# Patient Record
Sex: Female | Born: 1985 | Hispanic: Yes | Marital: Married | Smoking: Never smoker
Health system: Southern US, Community
[De-identification: ages and names within clinical notes are randomized; demographics above are authoritative.]

## PROBLEM LIST (undated history)

## (undated) ENCOUNTER — Inpatient Hospital Stay (HOSPITAL_COMMUNITY): Payer: Self-pay

## (undated) DIAGNOSIS — Z789 Other specified health status: Secondary | ICD-10-CM

## (undated) DIAGNOSIS — I8390 Asymptomatic varicose veins of unspecified lower extremity: Secondary | ICD-10-CM

## (undated) HISTORY — PX: NO PAST SURGERIES: SHX2092

---

## 2009-06-22 ENCOUNTER — Inpatient Hospital Stay (HOSPITAL_COMMUNITY): Admission: AD | Admit: 2009-06-22 | Discharge: 2009-06-22 | Payer: Self-pay | Admitting: Obstetrics & Gynecology

## 2009-10-04 ENCOUNTER — Encounter: Admission: RE | Admit: 2009-10-04 | Discharge: 2009-10-04 | Payer: Self-pay | Admitting: Family Medicine

## 2009-10-07 ENCOUNTER — Ambulatory Visit (HOSPITAL_COMMUNITY): Admission: RE | Admit: 2009-10-07 | Discharge: 2009-10-07 | Payer: Self-pay | Admitting: Obstetrics & Gynecology

## 2009-12-30 ENCOUNTER — Ambulatory Visit (HOSPITAL_COMMUNITY): Admission: RE | Admit: 2009-12-30 | Discharge: 2009-12-30 | Payer: Self-pay | Admitting: Obstetrics & Gynecology

## 2010-01-29 ENCOUNTER — Ambulatory Visit: Payer: Self-pay | Admitting: Obstetrics & Gynecology

## 2010-01-30 ENCOUNTER — Inpatient Hospital Stay (HOSPITAL_COMMUNITY): Admission: AD | Admit: 2010-01-30 | Discharge: 2010-01-30 | Payer: Self-pay | Admitting: Obstetrics & Gynecology

## 2010-01-30 ENCOUNTER — Inpatient Hospital Stay (HOSPITAL_COMMUNITY): Admission: AD | Admit: 2010-01-30 | Discharge: 2010-02-01 | Payer: Self-pay | Admitting: Obstetrics and Gynecology

## 2010-01-30 ENCOUNTER — Ambulatory Visit: Payer: Self-pay | Admitting: Family Medicine

## 2011-03-18 LAB — CBC
HCT: 39 % (ref 36.0–46.0)
Hemoglobin: 12.9 g/dL (ref 12.0–15.0)
Platelets: 340 10*3/uL (ref 150–400)
WBC: 17.8 10*3/uL — ABNORMAL HIGH (ref 4.0–10.5)

## 2011-04-06 LAB — URINALYSIS, ROUTINE W REFLEX MICROSCOPIC
Glucose, UA: NEGATIVE mg/dL
Leukocytes, UA: NEGATIVE
Protein, ur: NEGATIVE mg/dL
Specific Gravity, Urine: 1.02 (ref 1.005–1.030)
Urobilinogen, UA: 0.2 mg/dL (ref 0.0–1.0)

## 2011-04-06 LAB — ABO/RH: ABO/RH(D): O POS

## 2011-04-06 LAB — CBC
MCV: 90 fL (ref 78.0–100.0)
Platelets: 337 10*3/uL (ref 150–400)
RBC: 4.34 MIL/uL (ref 3.87–5.11)
WBC: 11.3 10*3/uL — ABNORMAL HIGH (ref 4.0–10.5)

## 2011-04-06 LAB — HCG, QUANTITATIVE, PREGNANCY: hCG, Beta Chain, Quant, S: 132730 m[IU]/mL — ABNORMAL HIGH (ref <5)

## 2011-04-06 LAB — POCT PREGNANCY, URINE: Preg Test, Ur: POSITIVE

## 2011-04-06 LAB — URINE MICROSCOPIC-ADD ON

## 2011-04-06 LAB — WET PREP, GENITAL
Trich, Wet Prep: NONE SEEN
Yeast Wet Prep HPF POC: NONE SEEN

## 2014-12-28 NOTE — L&D Delivery Note (Signed)
Delivery Note At 8:56 AM a viable and healthy female was delivered via  (Presentation: Right Occiput Posterior).  APGAR: 7, 9; weight pending.   Pitocin started w/ delivery of baby for active management of third stage. Placenta status: Adherent at right fundal area, Manual removal. Placenta appeared intact except for questionable area that was smooth and C/W heart -shaped placenta. Cord: 3 vessels with the following complications: None.  Cord pH: NA  Dr. Jolayne Panther called to [facility] when manual removal started. Pitocin stopped. CNM removed placenta w/ difficulty due to adherent area in fundus. One moderate gush of blood, pulse 90-120's. BP stable, mildly elevated w/ pain of uterine exploration. Upon second sweep of uterus no additional placenta fragments were felt or removed, but uterus became boggy and full of clots and blood. Bladder emptied while Dr. Jolayne Panther swept uterus again and inspected cervix for lacs (intact). No placenta fragments found. Pitocin, Methergine given. Fundus firmer w/ massage, but bleeding still moderate. Cytotec given. Fundus firm. Bleeding small amount. VSS.    Anesthesia: Epidural  Episiotomy: None Lacerations: None Suture Repair: NA Est. Blood Loss (mL): 911  Mom to postpartum.  Baby to Couplet care / Skin to Skin. Placenta to: Pathology Feeding: Br/Bo Circ: No Contraception: Undecided  Methergine series x 24 hours.  Discussed blood loss w/ pt. Will check CBC now and in am. Brief discussion of transfusion if pt significantly symptomatic. She consents to transfusion if needed.   Jillian Turner 09/24/2015, 9:55 AM

## 2015-02-11 ENCOUNTER — Inpatient Hospital Stay (HOSPITAL_COMMUNITY)
Admission: AD | Admit: 2015-02-11 | Discharge: 2015-02-11 | Disposition: A | Discharge status: Home / Self Care | Payer: Self-pay | Source: Ambulatory Visit | Attending: Obstetrics & Gynecology | Admitting: Obstetrics & Gynecology

## 2015-02-11 ENCOUNTER — Encounter (HOSPITAL_COMMUNITY): Payer: Self-pay

## 2015-02-11 ENCOUNTER — Inpatient Hospital Stay (HOSPITAL_COMMUNITY): Payer: Self-pay

## 2015-02-11 DIAGNOSIS — O4690 Antepartum hemorrhage, unspecified, unspecified trimester: Secondary | ICD-10-CM

## 2015-02-11 DIAGNOSIS — Z3A08 8 weeks gestation of pregnancy: Secondary | ICD-10-CM

## 2015-02-11 DIAGNOSIS — Z3A01 Less than 8 weeks gestation of pregnancy: Secondary | ICD-10-CM | POA: Insufficient documentation

## 2015-02-11 DIAGNOSIS — B9689 Other specified bacterial agents as the cause of diseases classified elsewhere: Secondary | ICD-10-CM

## 2015-02-11 DIAGNOSIS — N760 Acute vaginitis: Secondary | ICD-10-CM | POA: Insufficient documentation

## 2015-02-11 DIAGNOSIS — A499 Bacterial infection, unspecified: Secondary | ICD-10-CM

## 2015-02-11 DIAGNOSIS — O43891 Other placental disorders, first trimester: Secondary | ICD-10-CM

## 2015-02-11 DIAGNOSIS — O208 Other hemorrhage in early pregnancy: Secondary | ICD-10-CM | POA: Insufficient documentation

## 2015-02-11 DIAGNOSIS — O23591 Infection of other part of genital tract in pregnancy, first trimester: Secondary | ICD-10-CM | POA: Insufficient documentation

## 2015-02-11 DIAGNOSIS — R109 Unspecified abdominal pain: Secondary | ICD-10-CM | POA: Insufficient documentation

## 2015-02-11 HISTORY — DX: Other specified health status: Z78.9

## 2015-02-11 LAB — URINALYSIS, ROUTINE W REFLEX MICROSCOPIC
BILIRUBIN URINE: NEGATIVE
Glucose, UA: NEGATIVE mg/dL
KETONES UR: NEGATIVE mg/dL
Leukocytes, UA: NEGATIVE
Nitrite: NEGATIVE
PH: 6 (ref 5.0–8.0)
PROTEIN: NEGATIVE mg/dL
Specific Gravity, Urine: 1.025 (ref 1.005–1.030)
UROBILINOGEN UA: 0.2 mg/dL (ref 0.0–1.0)

## 2015-02-11 LAB — WET PREP, GENITAL
TRICH WET PREP: NONE SEEN
YEAST WET PREP: NONE SEEN

## 2015-02-11 LAB — CBC
HCT: 36.2 % (ref 36.0–46.0)
HEMOGLOBIN: 12.3 g/dL (ref 12.0–15.0)
MCH: 29.9 pg (ref 26.0–34.0)
MCHC: 34 g/dL (ref 30.0–36.0)
MCV: 88.1 fL (ref 78.0–100.0)
PLATELETS: 301 10*3/uL (ref 150–400)
RBC: 4.11 MIL/uL (ref 3.87–5.11)
RDW: 13 % (ref 11.5–15.5)
WBC: 8.4 10*3/uL (ref 4.0–10.5)

## 2015-02-11 LAB — URINE MICROSCOPIC-ADD ON

## 2015-02-11 LAB — HCG, QUANTITATIVE, PREGNANCY: hCG, Beta Chain, Quant, S: 79671 m[IU]/mL — ABNORMAL HIGH (ref <5)

## 2015-02-11 LAB — POCT PREGNANCY, URINE: Preg Test, Ur: POSITIVE — AB

## 2015-02-11 MED ORDER — METRONIDAZOLE 500 MG PO TABS: 500.0000 mg | ORAL_TABLET | Freq: Two times a day (BID) | ORAL | Status: DC

## 2015-02-11 NOTE — Progress Notes (Signed)
I assisted [name], RN with questions.  Eda H Royal Interpreter. °

## 2015-02-11 NOTE — Progress Notes (Signed)
I assisted Jennifer,NP with explanation of care plan and I was with the patient in US. Eda H Royal Interpreter.

## 2015-02-11 NOTE — MAU Note (Signed)
Pos HPT 3 weeks ago, had small amount of bleeding 2 weeks ago which stopped.  Started spotting again last night, also lower abd pain.

## 2015-02-11 NOTE — Discharge Instructions (Signed)
Vaginosis bacteriana (Bacterial Vaginosis) La vaginosis bacteriana es una infeccin vaginal que perturba el equilibrio normal de las bacterias que se encuentran en la vagina. Es el resultado de un crecimiento excesivo de ciertas bacterias. Esta es la infeccin vaginal ms frecuente en mujeres en edad reproductiva. El tratamiento es importante para prevenir complicaciones, especialmente en mujeres embarazadas, dado que puede causar un parto prematuro. CAUSAS  La vaginosis bacteriana se origina por un aumento de bacterias nocivas que, generalmente, estn presentes en cantidades ms pequeas en la vagina. Varios tipos diferentes de bacterias pueden causar esta afeccin. Sin embargo, la causa de su desarrollo no se comprende totalmente. FACTORES DE RIESGO Ciertas actividades o comportamientos pueden exponerlo a un mayor riesgo de desarrollar vaginosis bacteriana, entre los que se incluyen:  Tener una nueva pareja sexual o mltiples parejas sexuales.  Las duchas vaginales  El uso del DIU (dispositivo intrauterino) como mtodo anticonceptivo. El contagio no se produce en baos, por ropas de cama, en piscinas o por contacto con objetos. SIGNOS Y SNTOMAS  Algunas mujeres que padecen vaginosis bacteriana no presentan signos ni sntomas. Los sntomas ms comunes son:  Secrecin vaginal de color grisceo.  Secrecin vaginal con olor similar al pescado, especialmente despus de mantener relaciones sexuales.  Picazn o sensacin de ardor en la vagina o la vulva.  Ardor o dolor al orinar. DIAGNSTICO  Su mdico analizar su historia clnica y le examinar la vagina para detectar signos de vaginosis bacteriana. Puede tomarle una muestra de flujo vaginal. Su mdico examinar esta muestra con un microscopio para controlar las bacterias y clulas anormales. Tambin puede realizarse un anlisis del pH vaginal.  TRATAMIENTO  La vaginosis bacteriana puede tratarse con antibiticos, en forma de comprimidos o  de crema vaginal. Puede indicarse una segunda tanda de antibiticos si la afeccin se repite despus del tratamiento.  INSTRUCCIONES PARA EL CUIDADO EN EL HOGAR   Tome solo medicamentos de venta libre o recetados, segn las indicaciones del mdico.  Si le han recetado antibiticos, tmelos como se le indic. Asegrese de que finaliza la prescripcin completa aunque se sienta mejor.  No mantenga relaciones sexuales hasta completar el tratamiento.  Comunique a sus compaeros sexuales que sufre una infeccin vaginal. Deben consultar a su mdico y recibir tratamiento si tienen problemas, como picazn o una erupcin cutnea leve.  Practique el sexo seguro usando preservativos y tenga un nico compaero sexual. SOLICITE ATENCIN MDICA SI:   Sus sntomas no mejoran despus de 3 das de tratamiento.  Aumenta la secrecin o el dolor.  Tiene fiebre. ASEGRESE DE QUE:   Comprende estas instrucciones.  Controlar su afeccin.  Recibir ayuda de inmediato si no mejora o si empeora. PARA OBTENER MS INFORMACIN  Centros para el control y la prevencin de enfermedades (Centers for Disease Control and Prevention, CDC): www.cdc.gov/std Asociacin Est[place]oun[place]ense de la Salud Sexual (American Sexual Health Association, SHA): www.ashastd.org  Document Released: 03/22/2008 Document Revised: 10/04/2013 ExitCare Patient Information 2015 [employer]. This information is not intended to replace advice given to you by your health care provider. Make sure you discuss any questions you have with your health care provider.  

## 2015-02-11 NOTE — MAU Note (Signed)
Per Eda, spanish interpreter, pt denies pain. No clots. Sometime sees BRB, sometimes is brown. Denies uti s/s. Last intercourse 2 weeks ago.

## 2015-02-11 NOTE — MAU Provider Note (Signed)
History     CSN: [id number]  Arrival date and time: 02/11/15 [id number]   First Provider Initiated Contact with Patient 02/11/15 0915      Chief Complaint  Patient presents with  . Vaginal Bleeding  . Abdominal Pain   HPI   Ms. Jillian Turner is a 29 y.o. female G3P1001 at 6783w1d presenting with abdominal pain and vaginal bleeding. The bleeding started 2 weeks ago and it stopped.  The bleeding resumed last night; she denies recent intercourse. She has continued to have spotting this morning. The color of the bleeding is bright red/ brown at times.  She is having a little bit of pain; lower abdominal pain and lower back pain.   OB History    Gravida Para Term Preterm AB TAB SAB Ectopic Multiple Living   3 1 1       1       Past Medical History  Diagnosis Date  . Medical history non-contributory     Past Surgical History  Procedure Laterality Date  . No past surgeries      No family history on file.  History  Substance Use Topics  . Smoking status: Not on file  . Smokeless tobacco: Not on file  . Alcohol Use: Not on file    Allergies: No Known Allergies  No prescriptions prior to admission   Results for orders placed or performed during the hospital encounter of 02/11/15 (from the past 48 hour(s))  Urinalysis, Routine w reflex microscopic     Status: Abnormal   Collection Time: 02/11/15  8:25 AM  Result Value Ref Range   Color, Urine YELLOW YELLOW   APPearance CLEAR CLEAR   Specific Gravity, Urine 1.025 1.005 - 1.030   pH 6.0 5.0 - 8.0   Glucose, UA NEGATIVE NEGATIVE mg/dL   Hgb urine dipstick SMALL (A) NEGATIVE   Bilirubin Urine NEGATIVE NEGATIVE   Ketones, ur NEGATIVE NEGATIVE mg/dL   Protein, ur NEGATIVE NEGATIVE mg/dL   Urobilinogen, UA 0.2 0.0 - 1.0 mg/dL   Nitrite NEGATIVE NEGATIVE   Leukocytes, UA NEGATIVE NEGATIVE  Urine microscopic-add on     Status: Abnormal   Collection Time: 02/11/15  8:25 AM  Result Value Ref Range   Squamous Epithelial / LPF  FEW (A) RARE   WBC, UA 0-2 <3 WBC/hpf   RBC / HPF 3-6 <3 RBC/hpf   Bacteria, UA FEW (A) RARE   Urine-Other MUCOUS PRESENT   Pregnancy, urine POC     Status: Abnormal   Collection Time: 02/11/15  8:37 AM  Result Value Ref Range   Preg Test, Ur POSITIVE (A) NEGATIVE    Comment:        THE SENSITIVITY OF THIS METHODOLOGY IS >24 mIU/mL   Wet prep, genital     Status: Abnormal   Collection Time: 02/11/15  9:45 AM  Result Value Ref Range   Yeast Wet Prep HPF POC NONE SEEN NONE SEEN   Trich, Wet Prep NONE SEEN NONE SEEN   Clue Cells Wet Prep HPF POC MODERATE (A) NONE SEEN   WBC, Wet Prep HPF POC FEW (A) NONE SEEN    Comment: MANY BACTERIA SEEN  CBC     Status: None   Collection Time: 02/11/15  9:54 AM  Result Value Ref Range   WBC 8.4 4.0 - 10.5 K/uL   RBC 4.11 3.87 - 5.11 MIL/uL   Hemoglobin 12.3 12.0 - 15.0 g/dL   HCT 56.236.2 13.036.0 - 86.546.0 %   MCV 88.1  78.0 - 100.0 fL   MCH 29.9 26.0 - 34.0 pg   MCHC 34.0 30.0 - 36.0 g/dL   RDW 40.9 81.1 - 91.4 %   Platelets 301 150 - 400 K/uL  hCG, quantitative, pregnancy     Status: Abnormal   Collection Time: 02/11/15  9:54 AM  Result Value Ref Range   hCG, Beta Chain, Quant, S 78295 (H) <5 mIU/mL    Comment:          GEST. AGE      CONC.  (mIU/mL)   <=1 WEEK        5 - 50     2 WEEKS       50 - 500     3 WEEKS       100 - 10,000     4 WEEKS     1,000 - 30,000     5 WEEKS     3,500 - 115,000   6-8 WEEKS     12,000 - 270,000    12 WEEKS     15,000 - 220,000        FEMALE AND NON-PREGNANT FEMALE:     LESS THAN 5 mIU/mL    US Ob Comp Less 14 Wks  02/11/2015   ADDENDUM REPORT: 02/11/2015 11:35  ADDENDUM: A small subchorionic hemorrhage is noted.   Electronically Signed   By: Marin Roberts M.D.   On: 02/11/2015 11:35   02/11/2015   CLINICAL DATA:  Vaginal bleeding and pregnancy. First-trimester pregnancy.  EXAM: OBSTETRIC <14 WK [place] AND TRANSVAGINAL OB US  TECHNIQUE: Both transabdominal and transvaginal ultrasound examinations were  performed for complete evaluation of the gestation as well as the maternal uterus, adnexal regions, and pelvic cul-de-sac. Transvaginal technique was performed to assess early pregnancy.  COMPARISON:  None for this pregnancy.  FINDINGS: Intrauterine gestational sac: Present.  Single.  Yolk sac:  Present  Embryo:  Present  Cardiac Activity: Present  Heart Rate: 154  bpm  MSD:   mm    w     d  CRL:  1.49  mm   7 w   6 d                  [place] EDC: 09/24/2015  Maternal uterus/adnexa: Uterus and adnexa are otherwise unremarkable. A corpus luteal cyst is present on the left. There is no significant free fluid.  IMPRESSION: 1. Single intrauterine pregnancy with an estimated gestational age of [redacted] weeks and 6 days. 2. No complicating features evident by ultrasound.  Electronically Signed: By: Marin Roberts M.D. On: 02/11/2015 11:01   US Ob Transvaginal  02/11/2015   ADDENDUM REPORT: 02/11/2015 11:35  ADDENDUM: A small subchorionic hemorrhage is noted.   Electronically Signed   By: Marin Roberts M.D.   On: 02/11/2015 11:35   02/11/2015   CLINICAL DATA:  Vaginal bleeding and pregnancy. First-trimester pregnancy.  EXAM: OBSTETRIC <14 WK [place] AND TRANSVAGINAL OB US  TECHNIQUE: Both transabdominal and transvaginal ultrasound examinations were performed for complete evaluation of the gestation as well as the maternal uterus, adnexal regions, and pelvic cul-de-sac. Transvaginal technique was performed to assess early pregnancy.  COMPARISON:  None for this pregnancy.  FINDINGS: Intrauterine gestational sac: Present.  Single.  Yolk sac:  Present  Embryo:  Present  Cardiac Activity: Present  Heart Rate: 154  bpm  MSD:   mm    w     d  CRL:  1.49  mm   7  w   6 d                  [place] EDC: 09/24/2015  Maternal uterus/adnexa: Uterus and adnexa are otherwise unremarkable. A corpus luteal cyst is present on the left. There is no significant free fluid.  IMPRESSION: 1. Single intrauterine pregnancy with an estimated gestational  age of [redacted] weeks and 6 days. 2. No complicating features evident by ultrasound.  Electronically Signed: By: Marin Roberts M.D. On: 02/11/2015 11:01   Review of Systems  Constitutional: Negative for fever and chills.  Gastrointestinal: Positive for nausea and abdominal pain (Bilateral lower abdominal pain ). Negative for vomiting.  Genitourinary: Positive for frequency. Negative for dysuria, urgency and hematuria.   Physical Exam   Blood pressure 111/56, pulse 87, temperature 98.8 F (37.1 C), temperature source Oral, resp. rate 16, height 5' 1.5" (1.562 m), weight 51.166 kg (112 lb 12.8 oz), last menstrual period 12/16/2014.  Physical Exam  Constitutional: She is oriented to person, place, and time. She appears well-developed and well-nourished. No distress.  HENT:  Head: Normocephalic.  Eyes: Pupils are equal, round, and reactive to light.  Neck: Neck supple.  Respiratory: Effort normal.  GI: Soft. She exhibits no distension. There is no tenderness. There is no rebound and no guarding.  Genitourinary:  Speculum exam: Vagina - Small amount of creamy, brown blood noted in the vaginal canal. Cervix - No contact bleeding Bimanual exam: Cervix closed, no CMT  Uterus non tender, enlarged  Adnexa non tender, no masses bilaterally GC/Chlam, wet prep done Chaperone present for exam.   Neurological: She is alert and oriented to person, place, and time.  Skin: Skin is warm. She is not diaphoretic.  Psychiatric: Her behavior is normal.    MAU Course  Procedures  None  MDM CBC O positive blood type Hcg [place]  Preliminary report shows subchorionic hemorrhage noted. Spoke to Dr. Alfredo Batty who reviewed the [place] again and will addend and note a small subchorionic hemorrhage.   Assessment and Plan   A: Vaginal bleeding in pregnancy SIUP @ [redacted]w[redacted]d with cardiac activity  Bacterial vaginosis Small subchorionic hemorrhage.  P: Discharge home in stable condition Pelvic rest Bleeding  precautions Start prenatal care ASAP RX: Flagyl Return to MAU if symptoms worsen.  Jillian Hansen Janijah Symons, NP 02/11/2015 5:08 PM

## 2015-02-12 LAB — GC/CHLAMYDIA PROBE AMP (~~LOC~~) NOT AT ARMC
Chlamydia: NEGATIVE
NEISSERIA GONORRHEA: NEGATIVE

## 2015-02-12 LAB — HIV ANTIBODY (ROUTINE TESTING W REFLEX): HIV Screen 4th Generation wRfx: NONREACTIVE

## 2015-04-04 ENCOUNTER — Other Ambulatory Visit (HOSPITAL_COMMUNITY): Payer: Self-pay | Admitting: Nurse Practitioner

## 2015-04-04 DIAGNOSIS — Z3689 Encounter for other specified antenatal screening: Secondary | ICD-10-CM

## 2015-04-04 LAB — OB RESULTS CONSOLE RUBELLA ANTIBODY, IGM: Rubella: IMMUNE

## 2015-04-04 LAB — OB RESULTS CONSOLE HEPATITIS B SURFACE ANTIGEN: Hepatitis B Surface Ag: NEGATIVE

## 2015-04-04 LAB — OB RESULTS CONSOLE HIV ANTIBODY (ROUTINE TESTING)
HIV: NONREACTIVE
HIV: NONREACTIVE

## 2015-04-04 LAB — OB RESULTS CONSOLE RPR: RPR: NONREACTIVE

## 2015-04-30 ENCOUNTER — Ambulatory Visit (HOSPITAL_COMMUNITY)
Admission: RE | Admit: 2015-04-30 | Discharge: 2015-04-30 | Disposition: A | Discharge status: Home / Self Care | Payer: Self-pay | Source: Ambulatory Visit | Attending: Nurse Practitioner | Admitting: Nurse Practitioner

## 2015-04-30 DIAGNOSIS — Z3689 Encounter for other specified antenatal screening: Secondary | ICD-10-CM | POA: Insufficient documentation

## 2015-04-30 DIAGNOSIS — Z3A19 19 weeks gestation of pregnancy: Secondary | ICD-10-CM | POA: Insufficient documentation

## 2015-04-30 DIAGNOSIS — Z36 Encounter for antenatal screening of mother: Secondary | ICD-10-CM | POA: Insufficient documentation

## 2015-05-02 ENCOUNTER — Other Ambulatory Visit (HOSPITAL_COMMUNITY): Payer: Self-pay | Admitting: Nurse Practitioner

## 2015-05-02 DIAGNOSIS — IMO0002 Reserved for concepts with insufficient information to code with codable children: Secondary | ICD-10-CM

## 2015-05-02 DIAGNOSIS — Z0489 Encounter for examination and observation for other specified reasons: Secondary | ICD-10-CM

## 2015-05-02 DIAGNOSIS — Z3689 Encounter for other specified antenatal screening: Secondary | ICD-10-CM

## 2015-06-11 ENCOUNTER — Ambulatory Visit (HOSPITAL_COMMUNITY)
Admission: RE | Admit: 2015-06-11 | Discharge: 2015-06-11 | Disposition: A | Discharge status: Home / Self Care | Payer: Self-pay | Source: Ambulatory Visit | Attending: Nurse Practitioner | Admitting: Nurse Practitioner

## 2015-06-11 ENCOUNTER — Ambulatory Visit (HOSPITAL_COMMUNITY): Payer: Self-pay

## 2015-06-11 DIAGNOSIS — IMO0002 Reserved for concepts with insufficient information to code with codable children: Secondary | ICD-10-CM

## 2015-06-11 DIAGNOSIS — Z36 Encounter for antenatal screening of mother: Secondary | ICD-10-CM | POA: Insufficient documentation

## 2015-06-11 DIAGNOSIS — Z0489 Encounter for examination and observation for other specified reasons: Secondary | ICD-10-CM

## 2015-07-15 ENCOUNTER — Other Ambulatory Visit (HOSPITAL_COMMUNITY): Payer: Self-pay | Admitting: Nurse Practitioner

## 2015-07-15 DIAGNOSIS — O26849 Uterine size-date discrepancy, unspecified trimester: Secondary | ICD-10-CM

## 2015-07-17 ENCOUNTER — Ambulatory Visit (HOSPITAL_COMMUNITY)
Admission: RE | Admit: 2015-07-17 | Discharge: 2015-07-17 | Disposition: A | Discharge status: Home / Self Care | Payer: Self-pay | Source: Ambulatory Visit | Attending: Nurse Practitioner | Admitting: Nurse Practitioner

## 2015-07-17 DIAGNOSIS — Z3A30 30 weeks gestation of pregnancy: Secondary | ICD-10-CM | POA: Insufficient documentation

## 2015-07-17 DIAGNOSIS — O26849 Uterine size-date discrepancy, unspecified trimester: Secondary | ICD-10-CM | POA: Insufficient documentation

## 2015-07-17 DIAGNOSIS — O3660X0 Maternal care for excessive fetal growth, unspecified trimester, not applicable or unspecified: Secondary | ICD-10-CM | POA: Insufficient documentation

## 2015-08-22 ENCOUNTER — Other Ambulatory Visit (HOSPITAL_COMMUNITY): Payer: Self-pay | Admitting: Nurse Practitioner

## 2015-08-22 DIAGNOSIS — IMO0002 Reserved for concepts with insufficient information to code with codable children: Secondary | ICD-10-CM

## 2015-08-22 DIAGNOSIS — Z0489 Encounter for examination and observation for other specified reasons: Secondary | ICD-10-CM

## 2015-08-23 ENCOUNTER — Ambulatory Visit (HOSPITAL_COMMUNITY)
Admission: RE | Admit: 2015-08-23 | Discharge: 2015-08-23 | Disposition: A | Discharge status: Home / Self Care | Payer: Self-pay | Source: Ambulatory Visit | Attending: Nurse Practitioner | Admitting: Nurse Practitioner

## 2015-08-23 ENCOUNTER — Other Ambulatory Visit (HOSPITAL_COMMUNITY): Payer: Self-pay | Admitting: Nurse Practitioner

## 2015-08-23 DIAGNOSIS — O26843 Uterine size-date discrepancy, third trimester: Secondary | ICD-10-CM

## 2015-08-23 DIAGNOSIS — IMO0002 Reserved for concepts with insufficient information to code with codable children: Secondary | ICD-10-CM

## 2015-08-23 DIAGNOSIS — Z3A35 35 weeks gestation of pregnancy: Secondary | ICD-10-CM

## 2015-08-23 DIAGNOSIS — Z0489 Encounter for examination and observation for other specified reasons: Secondary | ICD-10-CM

## 2015-08-29 LAB — OB RESULTS CONSOLE GBS: GBS: NEGATIVE

## 2015-08-29 LAB — OB RESULTS CONSOLE GC/CHLAMYDIA
CHLAMYDIA, DNA PROBE: NEGATIVE
Gonorrhea: NEGATIVE

## 2015-09-19 ENCOUNTER — Other Ambulatory Visit (HOSPITAL_COMMUNITY): Payer: Self-pay | Admitting: Nurse Practitioner

## 2015-09-19 DIAGNOSIS — O26843 Uterine size-date discrepancy, third trimester: Secondary | ICD-10-CM

## 2015-09-19 DIAGNOSIS — Z3A40 40 weeks gestation of pregnancy: Secondary | ICD-10-CM

## 2015-09-23 ENCOUNTER — Other Ambulatory Visit (HOSPITAL_COMMUNITY): Payer: Self-pay | Admitting: Nurse Practitioner

## 2015-09-23 ENCOUNTER — Encounter (HOSPITAL_COMMUNITY): Payer: Self-pay | Admitting: Obstetrics and Gynecology

## 2015-09-23 ENCOUNTER — Ambulatory Visit (HOSPITAL_COMMUNITY)
Admission: RE | Admit: 2015-09-23 | Discharge: 2015-09-23 | Disposition: A | Discharge status: Home / Self Care | Payer: Self-pay | Source: Ambulatory Visit | Attending: Nurse Practitioner | Admitting: Nurse Practitioner

## 2015-09-23 ENCOUNTER — Inpatient Hospital Stay (HOSPITAL_COMMUNITY)
Admission: AD | Admit: 2015-09-23 | Discharge: 2015-09-25 | DRG: 774 | Disposition: A | Discharge status: Home / Self Care | Payer: Medicaid Other | Source: Ambulatory Visit | Attending: Family Medicine | Admitting: Family Medicine

## 2015-09-23 DIAGNOSIS — Z3A40 40 weeks gestation of pregnancy: Secondary | ICD-10-CM | POA: Diagnosis present

## 2015-09-23 DIAGNOSIS — O480 Post-term pregnancy: Secondary | ICD-10-CM | POA: Diagnosis present

## 2015-09-23 DIAGNOSIS — O864 Pyrexia of unknown origin following delivery: Secondary | ICD-10-CM | POA: Diagnosis present

## 2015-09-23 DIAGNOSIS — O4103X0 Oligohydramnios, third trimester, not applicable or unspecified: Secondary | ICD-10-CM | POA: Diagnosis present

## 2015-09-23 DIAGNOSIS — O09299 Supervision of pregnancy with other poor reproductive or obstetric history, unspecified trimester: Secondary | ICD-10-CM | POA: Diagnosis present

## 2015-09-23 DIAGNOSIS — O4100X0 Oligohydramnios, unspecified trimester, not applicable or unspecified: Secondary | ICD-10-CM | POA: Diagnosis present

## 2015-09-23 DIAGNOSIS — O26843 Uterine size-date discrepancy, third trimester: Secondary | ICD-10-CM | POA: Insufficient documentation

## 2015-09-23 HISTORY — DX: Asymptomatic varicose veins of unspecified lower extremity: I83.90

## 2015-09-23 LAB — TYPE AND SCREEN
ABO/RH(D): O POS
ANTIBODY SCREEN: NEGATIVE

## 2015-09-23 LAB — CBC
HCT: 33.9 % — ABNORMAL LOW (ref 36.0–46.0)
Hemoglobin: 10.9 g/dL — ABNORMAL LOW (ref 12.0–15.0)
MCH: 27.6 pg (ref 26.0–34.0)
MCHC: 32.2 g/dL (ref 30.0–36.0)
MCV: 85.8 fL (ref 78.0–100.0)
PLATELETS: 318 10*3/uL (ref 150–400)
RBC: 3.95 MIL/uL (ref 3.87–5.11)
RDW: 14.6 % (ref 11.5–15.5)
WBC: 10.1 10*3/uL (ref 4.0–10.5)

## 2015-09-23 MED ORDER — MISOPROSTOL 25 MCG QUARTER TABLET
25.0000 ug | ORAL_TABLET | ORAL | Status: DC | PRN
  Administered 2015-09-23 (×2): 25 ug via VAGINAL
  Filled 2015-09-23 (×3): qty 0.25

## 2015-09-23 MED ORDER — LACTATED RINGERS IV SOLN: 500.0000 mL | INTRAVENOUS | Status: DC | PRN

## 2015-09-23 MED ORDER — LIDOCAINE HCL (PF) 1 % IJ SOLN
30.0000 mL | INTRAMUSCULAR | Status: DC | PRN
  Filled 2015-09-23: qty 30

## 2015-09-23 MED ORDER — TERBUTALINE SULFATE 1 MG/ML IJ SOLN: 0.2500 mg | Freq: Once | INTRAMUSCULAR | Status: DC | PRN

## 2015-09-23 MED ORDER — CITRIC ACID-SODIUM CITRATE 334-500 MG/5ML PO SOLN: 30.0000 mL | ORAL | Status: DC | PRN

## 2015-09-23 MED ORDER — LACTATED RINGERS IV SOLN
INTRAVENOUS | Status: DC
  Administered 2015-09-23 – 2015-09-24 (×3): via INTRAVENOUS

## 2015-09-23 MED ORDER — OXYTOCIN 40 UNITS IN LACTATED RINGERS INFUSION - SIMPLE MED
62.5000 mL/h | INTRAVENOUS | Status: DC
  Administered 2015-09-24: 500 mL/h via INTRAVENOUS
  Filled 2015-09-23: qty 1000

## 2015-09-23 MED ORDER — OXYTOCIN BOLUS FROM INFUSION: 500.0000 mL | INTRAVENOUS | Status: DC

## 2015-09-23 MED ORDER — ONDANSETRON HCL 4 MG/2ML IJ SOLN
4.0000 mg | Freq: Four times a day (QID) | INTRAMUSCULAR | Status: DC | PRN
  Administered 2015-09-24: 4 mg via INTRAVENOUS
  Filled 2015-09-23 (×2): qty 2

## 2015-09-23 MED ORDER — OXYTOCIN 40 UNITS IN LACTATED RINGERS INFUSION - SIMPLE MED
1.0000 m[IU]/min | INTRAVENOUS | Status: DC
  Administered 2015-09-24: 2 m[IU]/min via INTRAVENOUS
  Filled 2015-09-23: qty 1000

## 2015-09-23 MED ORDER — ACETAMINOPHEN 325 MG PO TABS
650.0000 mg | ORAL_TABLET | ORAL | Status: DC | PRN
  Administered 2015-09-24: 650 mg via ORAL
  Filled 2015-09-23: qty 2

## 2015-09-23 NOTE — H&P (Signed)
LABOR AND DELIVERY ADMISSION HISTORY AND PHYSICAL NOTE  Jillian Turner is a 29 y.o. female G2P1001 with IUP at [redacted]w[redacted]d by L/8 presenting for iol 2/2 oligohydramnios seen in ultrasound today. That u/s obtained for size < dates. EFW is 29th percentile (3066 g). She reports +FMs, No LOF, no VB, no blurry vision, headaches or peripheral edema, and RUQ pain.     Prenatal History/Complications:  Past Medical History: Past Medical History  Diagnosis Date  . Medical history non-contributory     Past Surgical History: Past Surgical History  Procedure Laterality Date  . No past surgeries      Obstetrical History: OB History    Gravida Para Term Preterm AB TAB SAB Ectopic Multiple Living   Social History: Social History   Social History  . Marital Status: Single    Spouse Name: N/A  . Number of Children: N/A  . Years of Education: N/A   Social History Main Topics  . Smoking status: None  . Smokeless tobacco: None  . Alcohol Use: None  . Drug Use: None  . Sexual Activity: Not Asked   Other Topics Concern  . None   Social History Narrative    Family History: No family history on file.  Allergies: No Known Allergies  Prescriptions prior to admission  Medication Sig Dispense Refill Last Dose  . Prenatal Vit-Min-FA-Fish Oil ([facility] PRENATAL GUMMY PO) Take 2 tablets by mouth daily.   02/10/2015 at Unknown time  . [DISCONTINUED] metroNIDAZOLE (FLAGYL) 500 MG tablet Take 1 tablet (500 mg total) by mouth 2 (two) times daily. 14 tablet 0      Review of Systems   All systems reviewed and negative except as stated in HPI  Last menstrual period 12/16/2014. General appearance: alert, cooperative and appears stated age Lungs: clear to auscultation bilaterally Heart: regular rate and rhythm Abdomen: soft, non-tender; bowel sounds normal Pelvic: 1.5/50/-4 Extremities: No calf swelling or tenderness Presentation: cephalic Fetal monitoring:  125/mod/+a/-3 Uterine activity: quiet      Prenatal labs: ABO, Rh:  O positive Antibody:  negative Rubella:  immune RPR:   negative HBsAg:   negative HIV:   nevative GBS: Negative (09/01 0000)  1 hr Glucola 99 Genetic screening  Quad wnl Anatomy US wnl  Prenatal Transfer Tool  Maternal Diabetes: No Genetic Screening: Normal Maternal Ultrasounds/Referrals: Abnormal:  Findings:   Other: oligohydramnios on 9/26 u/s Fetal Ultrasounds or other Referrals:  None Maternal Substance Abuse:  No Significant Maternal Medications:  None Significant Maternal Lab Results: Lab values include: Group B Strep negative  No results found for this or any previous visit (from the past 24 hour(s)).  Patient Active Problem List   Diagnosis Date Noted  . Oligohydramnios 09/23/2015  . [redacted] weeks gestation of pregnancy   . Significant discrepancy between uterine size and clinical dates, antepartum   . [redacted] weeks gestation of pregnancy   . Encounter for fetal anatomic survey     Assessment: Jillian Turner is a 29 y.o. G2P1001 at [redacted]w[redacted]d here for IOL 2/2 oligohydramnios (AFI 4.84 cm today). That u/s obtained for measuring size < dates. Etiology not immediately apparent: otherwise uncomplicated pregnancy with normal anatomy scan and normal quad screen. BP  wnl and no preeclampsia symptoms.  #Labor: will place cytotec and foley, pit when able, AROM when safe to do so #Pain: Eventual epidural #FWB: Cat 1 #ID:  gbs negative #MOF: both #MOC:Paraguard #  Circ:  declines  Silvano Bilis 09/23/2015, 3:53 PM

## 2015-09-23 NOTE — Progress Notes (Signed)
Patient tolerated placement of foley bulb well.

## 2015-09-23 NOTE — Progress Notes (Signed)
[id number] tracing under this account from 1600 until now.

## 2015-09-24 ENCOUNTER — Inpatient Hospital Stay (HOSPITAL_COMMUNITY): Payer: Medicaid Other | Admitting: Anesthesiology

## 2015-09-24 ENCOUNTER — Encounter (HOSPITAL_COMMUNITY): Payer: Self-pay | Admitting: *Deleted

## 2015-09-24 DIAGNOSIS — O864 Pyrexia of unknown origin following delivery: Secondary | ICD-10-CM

## 2015-09-24 DIAGNOSIS — O480 Post-term pregnancy: Secondary | ICD-10-CM

## 2015-09-24 DIAGNOSIS — Z3A40 40 weeks gestation of pregnancy: Secondary | ICD-10-CM

## 2015-09-24 DIAGNOSIS — O4103X0 Oligohydramnios, third trimester, not applicable or unspecified: Secondary | ICD-10-CM

## 2015-09-24 LAB — CBC
HEMATOCRIT: 33.8 % — AB (ref 36.0–46.0)
HEMOGLOBIN: 11 g/dL — AB (ref 12.0–15.0)
MCH: 27.7 pg (ref 26.0–34.0)
MCHC: 32.5 g/dL (ref 30.0–36.0)
MCV: 85.1 fL (ref 78.0–100.0)
Platelets: 295 10*3/uL (ref 150–400)
RBC: 3.97 MIL/uL (ref 3.87–5.11)
RDW: 14.7 % (ref 11.5–15.5)
WBC: 19.8 10*3/uL — AB (ref 4.0–10.5)

## 2015-09-24 LAB — RPR: RPR Ser Ql: NONREACTIVE

## 2015-09-24 MED ORDER — MISOPROSTOL 200 MCG PO TABS
1000.0000 ug | ORAL_TABLET | Freq: Once | ORAL | Status: AC
  Administered 2015-09-24: 1000 ug via RECTAL

## 2015-09-24 MED ORDER — FENTANYL 2.5 MCG/ML BUPIVACAINE 1/10 % EPIDURAL INFUSION (WH - ANES)
14.0000 mL/h | INTRAMUSCULAR | Status: DC | PRN
  Administered 2015-09-24: 12 mL/h via EPIDURAL

## 2015-09-24 MED ORDER — LIDOCAINE-EPINEPHRINE (PF) 2 %-1:200000 IJ SOLN
INTRAMUSCULAR | Status: DC | PRN
  Administered 2015-09-24: 3 mL

## 2015-09-24 MED ORDER — PHENYLEPHRINE 40 MCG/ML (10ML) SYRINGE FOR IV PUSH (FOR BLOOD PRESSURE SUPPORT)
PREFILLED_SYRINGE | INTRAVENOUS | Status: AC
  Filled 2015-09-24: qty 20

## 2015-09-24 MED ORDER — CLINDAMYCIN PHOSPHATE 900 MG/50ML IV SOLN: 900.0000 mg | Freq: Once | INTRAVENOUS | Status: DC

## 2015-09-24 MED ORDER — METHYLERGONOVINE MALEATE 0.2 MG/ML IJ SOLN: 0.2000 mg | INTRAMUSCULAR | Status: DC | PRN

## 2015-09-24 MED ORDER — ACETAMINOPHEN 325 MG PO TABS: 650.0000 mg | ORAL_TABLET | ORAL | Status: DC | PRN

## 2015-09-24 MED ORDER — LANOLIN HYDROUS EX OINT
1.0000 | TOPICAL_OINTMENT | CUTANEOUS | Status: DC | PRN
Start: 2015-09-24 — End: 2015-09-25

## 2015-09-24 MED ORDER — PHENYLEPHRINE 40 MCG/ML (10ML) SYRINGE FOR IV PUSH (FOR BLOOD PRESSURE SUPPORT): 80.0000 ug | PREFILLED_SYRINGE | INTRAVENOUS | Status: DC | PRN

## 2015-09-24 MED ORDER — DIBUCAINE 1 % RE OINT
1.0000 | TOPICAL_OINTMENT | RECTAL | Status: DC | PRN
Start: 2015-09-24 — End: 2015-09-25

## 2015-09-24 MED ORDER — FERROUS SULFATE 325 (65 FE) MG PO TABS
325.0000 mg | ORAL_TABLET | Freq: Two times a day (BID) | ORAL | Status: DC
  Administered 2015-09-24 – 2015-09-25 (×2): 325 mg via ORAL
  Filled 2015-09-24 (×2): qty 1

## 2015-09-24 MED ORDER — ZOLPIDEM TARTRATE 5 MG PO TABS: 5.0000 mg | ORAL_TABLET | Freq: Every evening | ORAL | Status: DC | PRN

## 2015-09-24 MED ORDER — OXYCODONE-ACETAMINOPHEN 5-325 MG PO TABS: 1.0000 | ORAL_TABLET | ORAL | Status: DC | PRN

## 2015-09-24 MED ORDER — OXYTOCIN 40 UNITS IN LACTATED RINGERS INFUSION - SIMPLE MED: 62.5000 mL/h | INTRAVENOUS | Status: DC

## 2015-09-24 MED ORDER — ONDANSETRON HCL 4 MG/2ML IJ SOLN: 4.0000 mg | INTRAMUSCULAR | Status: DC | PRN

## 2015-09-24 MED ORDER — TETANUS-DIPHTH-ACELL PERTUSSIS 5-2.5-18.5 LF-MCG/0.5 IM SUSP: 0.5000 mL | Freq: Once | INTRAMUSCULAR | Status: DC

## 2015-09-24 MED ORDER — FENTANYL 2.5 MCG/ML BUPIVACAINE 1/10 % EPIDURAL INFUSION (WH - ANES)
INTRAMUSCULAR | Status: AC
  Filled 2015-09-24: qty 125

## 2015-09-24 MED ORDER — EPHEDRINE 5 MG/ML INJ: 10.0000 mg | INTRAVENOUS | Status: DC | PRN

## 2015-09-24 MED ORDER — METHYLERGONOVINE MALEATE 0.2 MG/ML IJ SOLN
INTRAMUSCULAR | Status: AC
  Filled 2015-09-24: qty 1

## 2015-09-24 MED ORDER — MISOPROSTOL 200 MCG PO TABS
ORAL_TABLET | ORAL | Status: AC
  Filled 2015-09-24: qty 5

## 2015-09-24 MED ORDER — IBUPROFEN 600 MG PO TABS
600.0000 mg | ORAL_TABLET | Freq: Four times a day (QID) | ORAL | Status: DC
  Administered 2015-09-24 – 2015-09-25 (×5): 600 mg via ORAL
  Filled 2015-09-24 (×5): qty 1

## 2015-09-24 MED ORDER — METHYLERGONOVINE MALEATE 0.2 MG PO TABS: 0.2000 mg | ORAL_TABLET | ORAL | Status: DC | PRN

## 2015-09-24 MED ORDER — SENNOSIDES-DOCUSATE SODIUM 8.6-50 MG PO TABS
2.0000 | ORAL_TABLET | ORAL | Status: DC
  Administered 2015-09-25: 2 via ORAL
  Filled 2015-09-24: qty 2

## 2015-09-24 MED ORDER — GENTAMICIN SULFATE 40 MG/ML IJ SOLN
Freq: Three times a day (TID) | INTRAVENOUS | Status: DC
  Administered 2015-09-24 – 2015-09-25 (×3): via INTRAVENOUS
  Filled 2015-09-24 (×5): qty 2.25

## 2015-09-24 MED ORDER — BUPIVACAINE HCL (PF) 0.25 % IJ SOLN
INTRAMUSCULAR | Status: DC | PRN
  Administered 2015-09-24 (×2): 4 mL via EPIDURAL

## 2015-09-24 MED ORDER — MEASLES, MUMPS & RUBELLA VAC ~~LOC~~ INJ: 0.5000 mL | INJECTION | Freq: Once | SUBCUTANEOUS | Status: DC

## 2015-09-24 MED ORDER — ONDANSETRON HCL 4 MG PO TABS: 4.0000 mg | ORAL_TABLET | ORAL | Status: DC | PRN

## 2015-09-24 MED ORDER — SIMETHICONE 80 MG PO CHEW: 80.0000 mg | CHEWABLE_TABLET | ORAL | Status: DC | PRN

## 2015-09-24 MED ORDER — DIPHENHYDRAMINE HCL 50 MG/ML IJ SOLN: 12.5000 mg | INTRAMUSCULAR | Status: DC | PRN

## 2015-09-24 MED ORDER — DIPHENHYDRAMINE HCL 25 MG PO CAPS: 25.0000 mg | ORAL_CAPSULE | Freq: Four times a day (QID) | ORAL | Status: DC | PRN

## 2015-09-24 MED ORDER — PRENATAL MULTIVITAMIN CH
1.0000 | ORAL_TABLET | Freq: Every day | ORAL | Status: DC
  Administered 2015-09-25: 1 via ORAL
  Filled 2015-09-24: qty 1

## 2015-09-24 MED ORDER — OXYCODONE-ACETAMINOPHEN 5-325 MG PO TABS: 2.0000 | ORAL_TABLET | ORAL | Status: DC | PRN

## 2015-09-24 MED ORDER — WITCH HAZEL-GLYCERIN EX PADS: 1.0000 "application " | MEDICATED_PAD | CUTANEOUS | Status: DC | PRN

## 2015-09-24 MED ORDER — METHYLERGONOVINE MALEATE 0.2 MG/ML IJ SOLN
0.2000 mg | Freq: Once | INTRAMUSCULAR | Status: AC
  Administered 2015-09-24: 0.2 mg via INTRAMUSCULAR

## 2015-09-24 MED ORDER — MAGNESIUM HYDROXIDE 400 MG/5ML PO SUSP: 30.0000 mL | ORAL | Status: DC | PRN

## 2015-09-24 MED ORDER — BENZOCAINE-MENTHOL 20-0.5 % EX AERO: 1.0000 "application " | INHALATION_SPRAY | CUTANEOUS | Status: DC | PRN

## 2015-09-24 NOTE — Progress Notes (Signed)
ANTIBIOTIC CONSULT NOTE - INITIAL  Pharmacy Consult for Gentamicin Indication: Retained Placenta  No Known Allergies  Patient Measurements: Height:  (149.9 cm) Weight: 127 lb (57.607 kg) IBW/kg (Calculated) : 43.2 Adjusted Body Weight: 47.6 kg  Vital Signs: Temp: 101.6 F (38.7 C) (09/27 1215) Temp Source: Oral (09/27 1215) BP: 87/60 mmHg (09/27 1215) Pulse Rate: 89 (09/27 1215)  Labs:  Recent Labs  09/23/15 1615 09/24/15 0954  WBC 10.1 19.8*  HGB 10.9* 11.0*  PLT 318 295     Microbiology: Recent Results (from the past 720 hour(s))  OB RESULT CONSOLE Group B Strep     Status: None   Collection Time: 08/29/15 12:00 AM  Result Value Ref Range Status   GBS Negative  Final    Assessment: 29 y.o. female G2P2001 at [redacted]w[redacted]d being initiated on gentamicin and clindamycin for post-partum fever possibly due to retained placenta.   Estimated Ke = 0.25, Vd = 16.6L  Goal of Therapy:  Gentamicin peak 6-8 mg/L and Trough < 1 mg/L  Plan:  Gentamicin 90 mg IV every 8 hrs with clindamycin 900 mg IV every 8 hrs Check Scr with next labs if gentamicin continued. Will check gentamicin levels if continued > 72hr or clinically indicated.  [place], Mindy [place] 09/24/2015,12:45 PM

## 2015-09-24 NOTE — Anesthesia Preprocedure Evaluation (Signed)

## 2015-09-24 NOTE — Anesthesia Postprocedure Evaluation (Signed)
Anesthesia Post Note  Patient: Jillian Turner  Procedure(s) Performed: * No procedures listed *  Anesthesia type: Epidural  Patient location: Mother/Baby  Post pain: Pain level controlled  Post assessment: Post-op Vital signs reviewed  Last Vitals:  Filed Vitals:   09/24/15 1215  BP: 87/60  Pulse: 89  Temp: 38.7 C  Resp:     Post vital signs: Reviewed  Level of consciousness:alert  Complications: No apparent anesthesia complications

## 2015-09-24 NOTE — Progress Notes (Signed)
Spanish interpreter, Eda, at bedside prior to delivery, after delivery for 10 minutes, and called back in to interpret postpartum events and procedures. All questions were answered.

## 2015-09-24 NOTE — Anesthesia Procedure Notes (Signed)
Epidural Patient location during procedure: OB  Staffing Anesthesiologist: MOSER, CHRISTOPHER Performed by: anesthesiologist   Preanesthetic Checklist Completed: patient identified, surgical consent, pre-op evaluation, timeout performed, IV checked, risks and benefits discussed and monitors and equipment checked  Epidural Patient position: sitting Prep: DuraPrep Patient monitoring: heart rate, cardiac monitor, continuous pulse ox and blood pressure Approach: midline Location: L3-L4 Injection technique: LOR saline  Needle:  Needle type: Tuohy  Needle gauge: 17 G Needle length: 9 cm Needle insertion depth: 6 cm Catheter type: closed end flexible Catheter size: 19 Gauge Catheter at skin depth: 11 cm Test dose: negative and 2% lidocaine with Epi 1:200 K  Assessment Events: blood not aspirated, injection not painful, no injection resistance, negative IV test and no paresthesia  Additional Notes Reason for block:procedure for pain   

## 2015-09-25 ENCOUNTER — Encounter (HOSPITAL_COMMUNITY): Payer: Self-pay | Admitting: Family Medicine

## 2015-09-25 LAB — CBC
HCT: 28.7 % — ABNORMAL LOW (ref 36.0–46.0)
HEMOGLOBIN: 9.1 g/dL — AB (ref 12.0–15.0)
MCH: 27.2 pg (ref 26.0–34.0)
MCHC: 31.7 g/dL (ref 30.0–36.0)
MCV: 85.7 fL (ref 78.0–100.0)
Platelets: 284 10*3/uL (ref 150–400)
RBC: 3.35 MIL/uL — AB (ref 3.87–5.11)
RDW: 15 % (ref 11.5–15.5)
WBC: 22.5 10*3/uL — ABNORMAL HIGH (ref 4.0–10.5)

## 2015-09-25 MED ORDER — IBUPROFEN 600 MG PO TABS: 600.0000 mg | ORAL_TABLET | Freq: Four times a day (QID) | ORAL | Status: DC

## 2015-09-25 NOTE — Discharge Instructions (Signed)

## 2015-09-25 NOTE — Discharge Summary (Signed)
OB Discharge Summary     Patient Name: Jillian Turner DOB: 07-18-1986 MRN: [id number]  Date of admission: 09/23/2015 Delivering MD: Manya Silvas   Date of discharge: 09/25/2015  Admitting diagnosis: direct admit, induction, 40w Intrauterine pregnancy: [redacted]w[redacted]d        Discharge diagnosis: Term Pregnancy Delivered                                           Adherent placenta necessitating manual removal                                             Postpartum Fever                                                                                        Post partum procedures:antibiotics - temperature 101.47F one hour after delivery                                              Manual removal of placenta  Augmentation: AROM, Pitocin, Cytotec and Foley Balloon  Complications: Adherent placenta requiring manual removal of placenta                          Postpartum Fever  Hospital course:  Induction of Labor With Vaginal Delivery   29y.o. yo G2P2001 at 479w2das admitted to the hospital 09/23/2015 for induction of labor.  Indication for induction: oligohydramnios.  Patient had an uncomplicated labor course as follows: Mediations and procedures used include: Foley Bulb, Cytotec, Pitocin and AROM Membrane Rupture Time/Date: 4:58 AM ,09/24/2015   Intrapartum Procedures: Episiotomy: None [1]                                         Lacerations:  None [1]  Patient had delivery of a Viable infant.  Information for the patient's newborn:  CrAliana, Kreischer0[[id number]]Delivery Method: Vaginal, Spontaneous Delivery (Filed from Delivery Summary)   09/24/2015  Details of delivery can be found in separate delivery note. She did have an adherent portion of her placenta necessitating manual removal of the placenta.  She had some heavy bleeding afterward.. She was placed on antibiotics. She did have two fevers on PPD #1 to 101.6.  Last fever was at 1215 yesterday. She has been afebrile since  then.  We will repeat a CBC this morning. If she remains afebrile and Hemoglobin is acceptable, she may go home today.      Physical exam  Filed Vitals:   09/24/15 1215 09/24/15 1315 09/24/15 1716 09/25/15 0053  BP: 87/60 117/65 117/66 104/62  Pulse: 89 81 73 68  Temp: 101.6  F (38.7 C) 98.4 F (36.9 C) 98.2 F (36.8 C) 97.8 F (36.6 C)  TempSrc: Oral Oral Oral Oral  Resp: 20 20 18 18   Height:      Weight:      SpO2:   100%    General: alert, cooperative and no distress Lochia: appropriate Uterine Fundus: firm Incision: N/A DVT Evaluation: No evidence of DVT seen on physical exam. No significant calf/ankle edema. Labs: Lab Results  Component Value Date   WBC 19.8* 09/24/2015   HGB 11.0* 09/24/2015   HCT 33.8* 09/24/2015   MCV 85.1 09/24/2015   PLT 295 09/24/2015   No flowsheet data found.  Discharge instruction: per After Visit Summary and "Baby and Me Booklet".  Medications:  Current facility-administered medications:  .  acetaminophen (TYLENOL) tablet 650 mg, 650 mg, Oral, Q4H PRN, Manya Silvas, CNM .  benzocaine-Menthol (DERMOPLAST) 20-0.5 % topical spray 1 application, 1 application, Topical, PRN, Manya Silvas, CNM .  witch hazel-glycerin (TUCKS) pad 1 application, 1 application, Topical, PRN **AND** dibucaine (NUPERCAINAL) 1 % rectal ointment 1 application, 1 application, Rectal, PRN, Manya Silvas, CNM .  diphenhydrAMINE (BENADRYL) capsule 25 mg, 25 mg, Oral, Q6H PRN, Manya Silvas, CNM .  ferrous sulfate tablet 325 mg, 325 mg, Oral, BID WC, Virginia Smith, CNM, 325 mg at 09/24/15 1820 .  gentamicin (GARAMYCIN) 90 mg, clindamycin (CLEOCIN) 900 mg in dextrose 5 % 100 mL IVPB, , Intravenous, Q8H, Mindy J Holcombe, RPH .  ibuprofen (ADVIL,MOTRIN) tablet 600 mg, 600 mg, Oral, 4 times per day, [place], CNM, 600 mg at 09/25/15 0052 .  lanolin ointment 1 application, 1 application, Topical, PRN, Manya Silvas, CNM .  magnesium hydroxide (MILK OF  MAGNESIA) suspension 30 mL, 30 mL, Oral, Q3 days PRN, Manya Silvas, CNM .  measles, mumps and rubella vaccine (MMR) injection 0.5 mL, 0.5 mL, Subcutaneous, Once, [place], [place] .  methylergonovine (METHERGINE) tablet 0.2 mg, 0.2 mg, Oral, Q4H PRN **OR** methylergonovine (METHERGINE) injection 0.2 mg, 0.2 mg, Intramuscular, Q4H PRN, Manya Silvas, CNM .  ondansetron (ZOFRAN) tablet 4 mg, 4 mg, Oral, Q4H PRN **OR** ondansetron (ZOFRAN) injection 4 mg, 4 mg, Intravenous, Q4H PRN, Manya Silvas, CNM .  oxyCODONE-acetaminophen (PERCOCET/ROXICET) 5-325 MG per tablet 1 tablet, 1 tablet, Oral, Q4H PRN, Manya Silvas, CNM .  oxyCODONE-acetaminophen (PERCOCET/ROXICET) 5-325 MG per tablet 2 tablet, 2 tablet, Oral, Q4H PRN, Manya Silvas, CNM .  oxytocin (PITOCIN) IV infusion 40 units in LR 1000 mL, 62.5 mL/hr, Intravenous, Continuous, Manya Silvas, CNM .  prenatal multivitamin tablet 1 tablet, 1 tablet, Oral, Q1200, Manya Silvas, CNM, 1 tablet at 09/24/15 1502 .  senna-docusate (Senokot-S) tablet 2 tablet, 2 tablet, Oral, Q24H, Manya Silvas, CNM, 2 tablet at 09/25/15 0052 .  simethicone (MYLICON) chewable tablet 80 mg, 80 mg, Oral, PRN, Manya Silvas, CNM .  Tdap (BOOSTRIX) injection 0.5 mL, 0.5 mL, Intramuscular, Once, [place], [place] .  zolpidem (AMBIEN) tablet 5 mg, 5 mg, Oral, QHS PRN, Manya Silvas, CNM  Facility-Administered Medications Ordered in Other Encounters:  .  bupivacaine (PF) (MARCAINE) 0.25 % injection, , , Anesthesia Intra-op, Oleta Mouse, MD, 4 mL at 09/24/15 0416 .  lidocaine-EPINEPHrine (XYLOCAINE W/EPI) 2 %-1:200000 (PF) injection, , Other, Anesthesia Intra-op, Oleta Mouse, MD, 3 mL at 09/24/15 0408  Diet: routine diet  Activity: Advance as tolerated. Pelvic rest for 6 weeks.   Outpatient follow up:6 weeks  Postpartum contraception: IUD Paragard  Newborn Data: Live born female  Birth Weight: 6 lb 4.5 oz (2849  g) APGAR: 7, 9  Baby Feeding:  Bottle and Breast Disposition:home with mother   09/25/2015 Adin Hector, MD   Seen and examined by me I did add a portion to the hospital course regarding the manual removal of the placenta and the postpartum fever Agree with note otherwise If she remains afebrile will discharge home.  Seabron Spates, CNM

## 2015-09-26 ENCOUNTER — Ambulatory Visit (HOSPITAL_COMMUNITY): Payer: Self-pay

## 2015-09-29 ENCOUNTER — Inpatient Hospital Stay (HOSPITAL_COMMUNITY): Admission: RE | Admit: 2015-09-29 | Payer: Self-pay | Source: Ambulatory Visit

## 2016-02-15 IMAGING — US US MFM OB FOLLOW-UP
1 series · 14 of 28 positions shown · non-contrast
Comparison: none

[Series 1: us mfm ob follow-up · 14 of 39 slices shown]
[im 2/39]
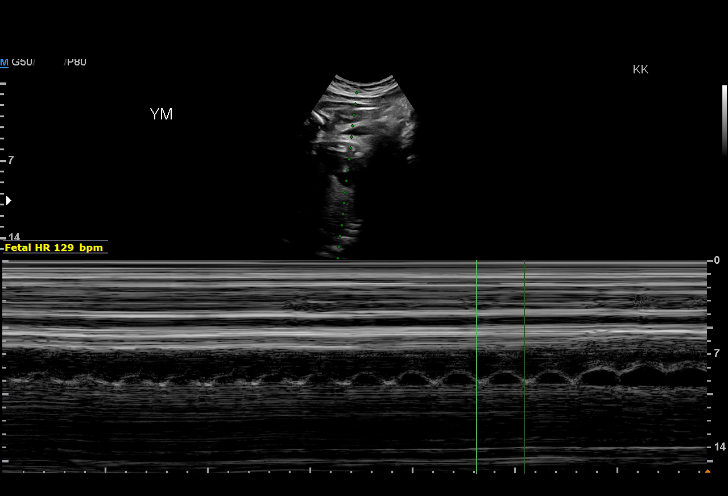
[im 5/39]
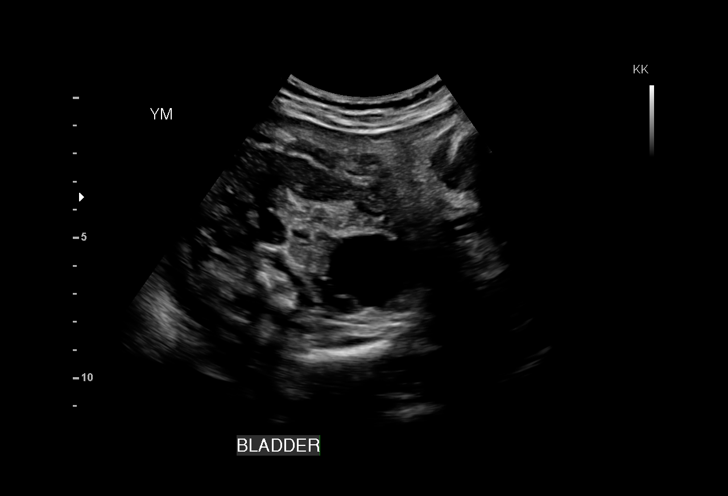
[im 8/39]
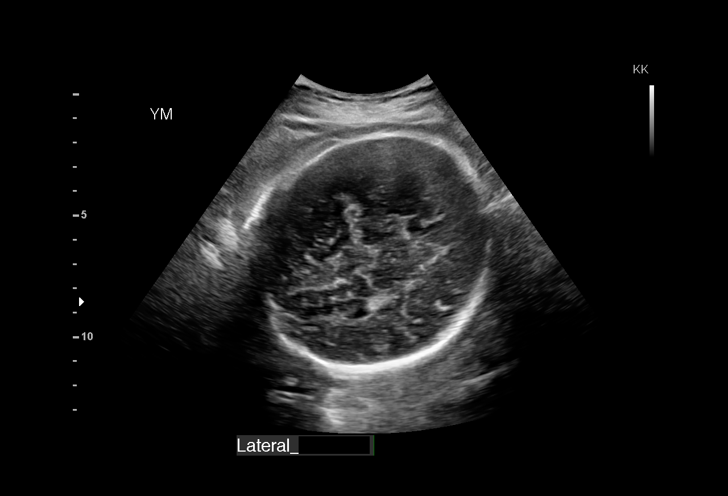
[im 10/39]
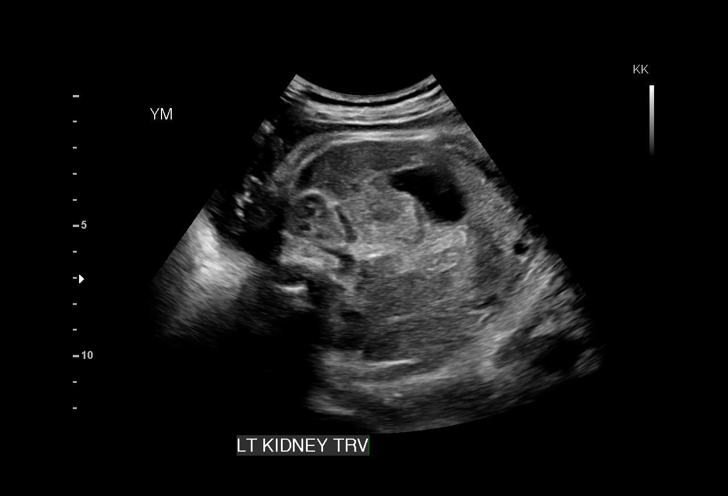
[im 13/39]
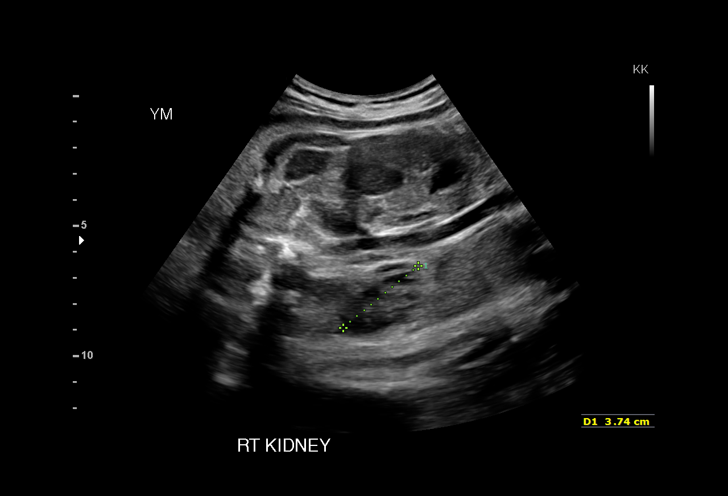
[im 16/39]
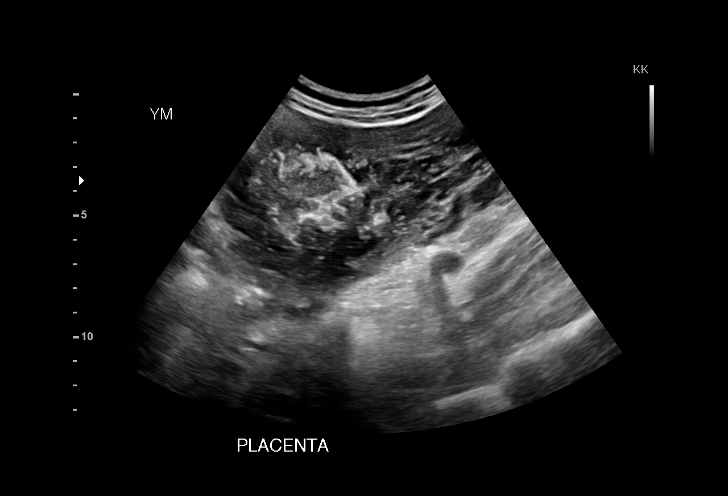
[im 19/39]
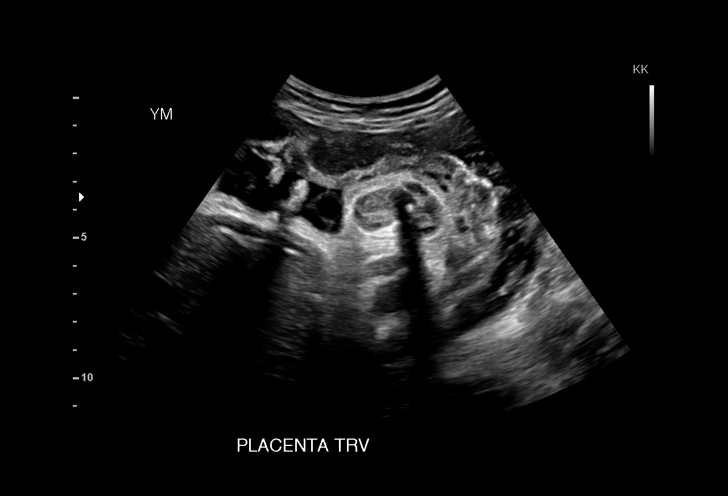
[im 22/39]
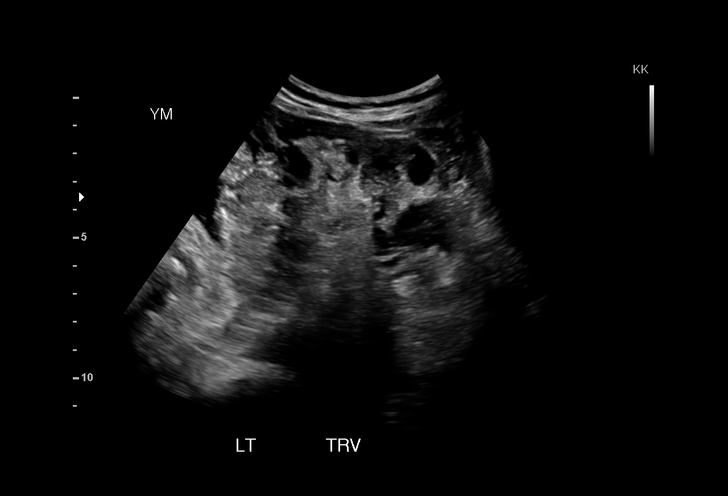
[im 24/39]
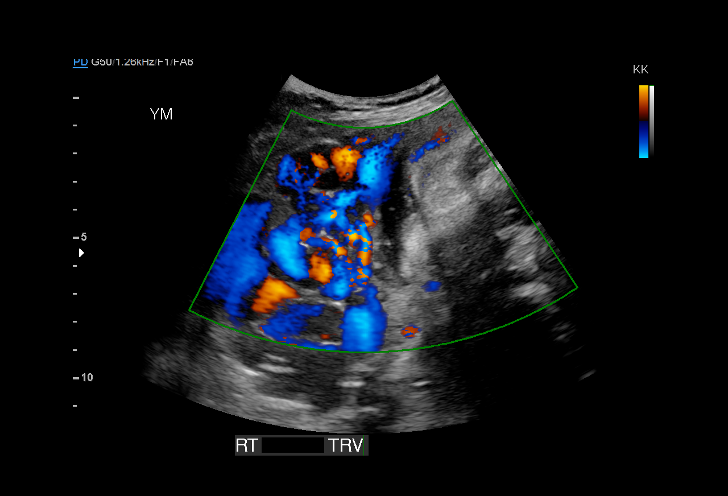
[im 27/39]
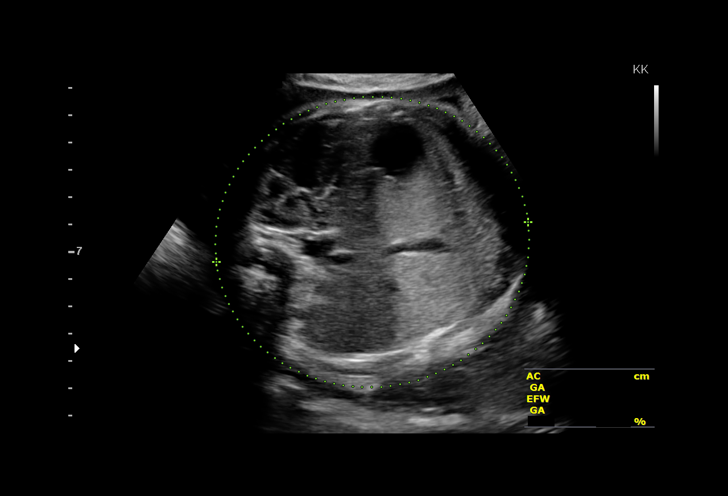
[im 30/39]
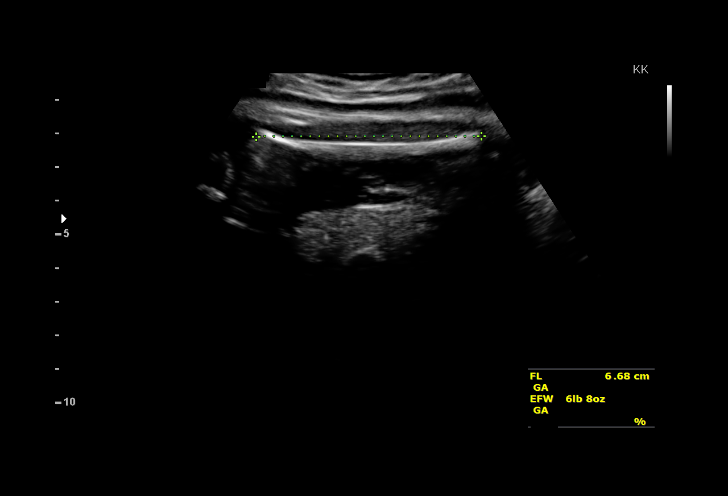
[im 33/39]
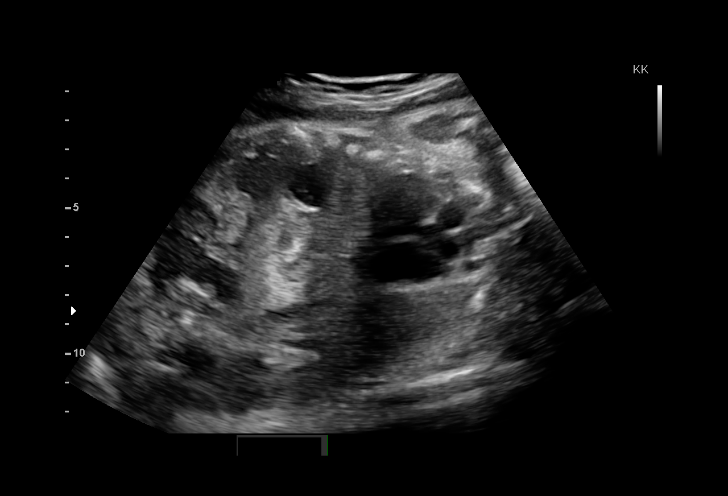
[im 36/39]
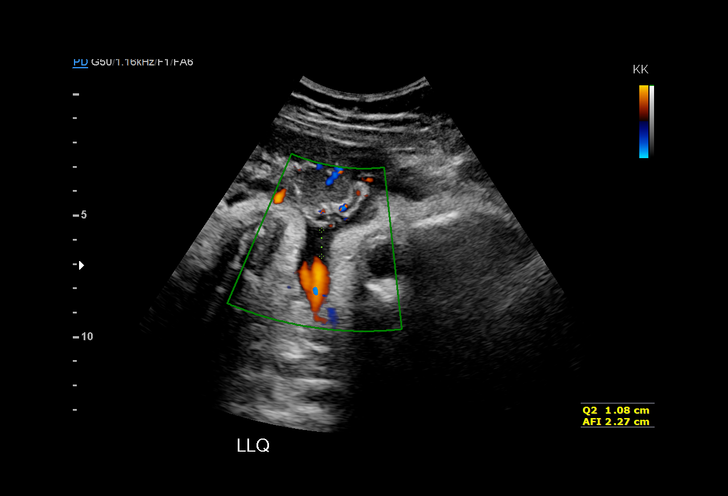
[im 39/39]
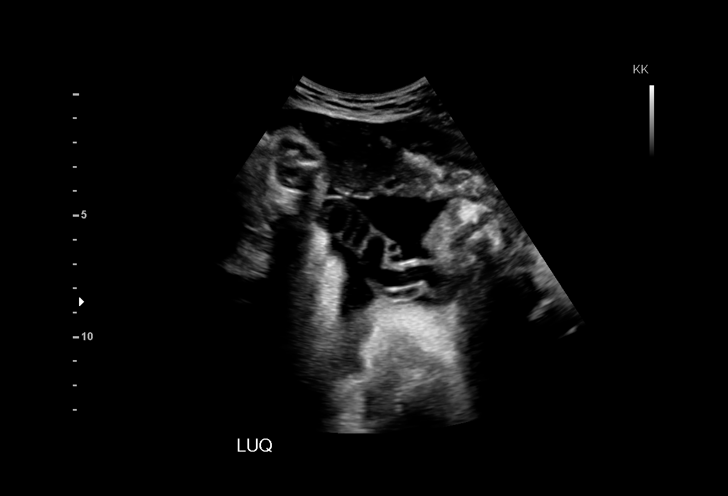

[14 of 28 positions shown; findings below may reference images not displayed]

OBSTETRICS REPORT
(Signed Final 09/23/2015 [DATE])

Name:       ENGR MICHAEL ONIKEKU                    Visit  09/23/2015 [DATE]
Date:

Service(s) Provided

Indications

Postdate pregnancy (40-42 weeks)
Fetal Evaluation

Num Of             1
Fetuses:
Fetal Heart        132                          bpm
Rate:
Cardiac Activity:  Observed
Presentation:      Cephalic
Placenta:          Left lateral, above
cervical os
P. Cord            Previously Visualized
Insertion:

Amniotic Fluid
AFI FV:      Oligohydramnios
AFI Sum:     4.84     cm     < 3  %Tile     Larg Pckt:    2.57   cm
RUQ:   1.19    cm    LUQ:   1.08    cm   LLQ:    2.57    cm
Biometry

BPD:     88.1   m    G. Age:   35w 4d                 CI:        73.11   70 - 86
m
FL/HC:      20.4   20.7 -
22.5
HC:     327.5   m    G. Age:   37w 1d                 HC/AC:      0.95   0.87 -
m
AC:     343.2   m    G. Age:   38w 2d                 FL/BPD      75.9   71 - 87
m                                     :
FL:      66.9   m    G. Age:   34w 3d                 FL/AC:      19.5   20 - 24
m

Est.        6800   gm   6 lb 12 oz      29   %
FW:
Gestational Age

LMP:           40w 1d        Date:  12/16/14                  EDD:   09/22/15
U/S Today:     36w 3d                                         EDD:   10/18/15
Best:          40w 1d    Det. By:   LMP  (12/16/14)           EDD:   09/22/15
Anatomy

Cranium:          Appears normal         Aortic Arch:       Previously seen
Fetal Cavum:      Previously seen        Ductal Arch:       Previously seen
Ventricles:       Appears normal         Diaphragm:         Appears normal
Choroid Plexus:   Previously seen        Stomach:           Appears normal,
left sided
Cerebellum:       Previously seen        Abdomen:           Appears normal
Posterior         Previously seen        Abdominal          Previously seen
Fossa:                                   Wall:
Nuchal Fold:      Previously seen        Cord Vessels:      Previously seen
Face:             Orbits and profile     Kidneys:           Appear normal
previously seen
Lips:             Previously seen        Bladder:           Appears normal
Heart:            Appears normal         Spine:             Previously seen
(4CH, axis, and
situs)
RVOT:             Previously seen        Lower              Previously seen
Extremities:
LVOT:             Previously seen        Upper              Previously seen
Extremities:

Other:   Male gender previously seen. Heels and 5th digit previously seen.
Lt 5th digit prev visualized. Profile appears normal.
Cervix Uterus Adnexa

Cervix:       Not visualized (advanced GA >15wks)

Adnexa:     No abnormality visualized.
Impression

Single IUP at 40w 1d
The estimated fetal weight is at the 29th %tile.
Oligohydramnios is noted (AFI 4.8 cm)
Left lateral placenta without previa
Recommendations

Given current gestational age, recommend delivery
Dr. Samir is aware of ultrasound findings.

## 2017-03-30 ENCOUNTER — Encounter: Payer: Self-pay | Admitting: Urgent Care

## 2017-03-30 ENCOUNTER — Ambulatory Visit (INDEPENDENT_AMBULATORY_CARE_PROVIDER_SITE_OTHER): Payer: Self-pay | Admitting: Urgent Care

## 2017-03-30 VITALS — BP 133/86 | HR 73 | Temp 98.6°F | Ht 59.0 in | Wt 120.4 lb

## 2017-03-30 DIAGNOSIS — R519 Headache, unspecified: Secondary | ICD-10-CM

## 2017-03-30 DIAGNOSIS — J0100 Acute maxillary sinusitis, unspecified: Secondary | ICD-10-CM

## 2017-03-30 DIAGNOSIS — J3489 Other specified disorders of nose and nasal sinuses: Secondary | ICD-10-CM

## 2017-03-30 DIAGNOSIS — R51 Headache: Secondary | ICD-10-CM

## 2017-03-30 DIAGNOSIS — Z9109 Other allergy status, other than to drugs and biological substances: Secondary | ICD-10-CM

## 2017-03-30 MED ORDER — CETIRIZINE HCL 10 MG PO TABS: 10.0000 mg | ORAL_TABLET | Freq: Every day | ORAL | 11 refills | Status: DC

## 2017-03-30 MED ORDER — AMOXICILLIN 500 MG PO CAPS: 500.0000 mg | ORAL_CAPSULE | Freq: Two times a day (BID) | ORAL | 0 refills | Status: DC

## 2017-03-30 MED ORDER — PSEUDOEPHEDRINE HCL ER 120 MG PO TB12: 120.0000 mg | ORAL_TABLET | Freq: Two times a day (BID) | ORAL | 3 refills | Status: DC

## 2017-03-30 NOTE — Patient Instructions (Addendum)
Sinusitis en adultos (Sinusitis, Adult) La sinusitis es la inflamacin y el dolor en los senos paranasales. Los senos paranasales son espacios vacos en los huesos alrededor del rostro. Los senos paranasales se encuentran ubicados:  Alrededor de los ojos.  En la mitad de la frente.  Detrs de la nariz.  En los pmulos. Los senos y las fosas nasales estn cubiertos de un lquido fibroso (mucosidad). Normalmente, la mucosidad drena a travs de los senos. Cuando los tejidos nasales se inflaman o hinchan, la mucosidad puede quedar atrapada o bloqueada, de modo que no puede fluir por los senos paranasales. Esto fomenta la proliferacin de bacterias, virus y hongos, lo que produce infecciones. La sinusitis puede desarrollarse rpidamente y durar entre 7 y 10das (aguda) o ms de 12das (crnica). A menudo, esta afeccin surge despus de un resfriado. CAUSAS Esta afeccin es causada por cualquier sustancia que inflame los senos o evite que la mucosidad drene, por ejemplo:  Alergias.  Asma.  Infecciones virales o bacterianas.  Huesos con forma anmala entre las fosas nasales.  Crecimientos nasales que contienen mucosidad (plipos nasales).  Aberturas sinusales estrechas.  Agentes contaminantes, como sustancias qumicas o irritantes presentes en el aire.  Un cuerpo extrao atorado en la nariz.  Infecciones por hongos. Esto es raro. FACTORES DE RIESGO Los siguientes factores pueden hacer que usted sea propenso a sufrir esta afeccin:  Padecer alergias o asma.  Haber tenido una infeccin reciente en las vas respiratorias superiores o un resfriado.  Tener deformidades estructurales o bloqueos en la nariz o los senos.  Tener un sistema inmunitario dbil.  Nadar o bucear mucho.  Abusar de los aerosoles nasales.  Fumar. SNTOMAS Los principales sntomas de esta afeccin son dolor y sensacin de presin alrededor de los senos afectados. Otros sntomas pueden ser los  siguientes:  Dolor en los dientes superiores.  Dolor de odos.  Dolor de cabeza.  Mal aliento.  Disminucin del sentido del olfato y del gusto.  Tos que empeora por la noche.  Fatiga.  Fiebre.  Mucosidad espesa que sale de la nariz. Generalmente, es de color verde y puede contener pus (purulento).  Nariz tapada o congestin nasal.  Goteo posnasal. Esto ocurre cuando se acumula mucosidad adicional en la garganta o la parte de atrs de la nariz.  Hinchazn y calor en los senos paranasales afectados.  Dolor de garganta.  Sensibilidad a la luz. DIAGNSTICO Esta enfermedad se diagnostica en funcin de los sntomas, los antecedentes mdicos y un examen fsico. Para descubrir si su afeccin es aguda o crnica, el mdico podra hacer lo siguiente:  Revisar su nariz en busca de plipos nasales.  Palpar los senos paranasales afectados para buscar signos de infeccin.  Observar la parte interna de los senos paranasales con un dispositivo que tiene una luz (endoscopio). Si el mdico sospecha que usted padece sinusitis crnica, podra indicarle lo siguiente:  Pruebas de alergias.  Una muestra de mucosidad de la nariz (cultivo nasal) para buscar bacterias.  Examen de una muestra de mucosidad, para ver si la sinusitis se relaciona con alguna alergia. Si la sinusitis no responde al tratamiento y dura ms de 8semanas, se le podra pedir una resonancia magntica o una tomografa computarizada para examinar los senos paranasales. Estos estudios tambin ayudan a determinar la gravedad de la infeccin. En contadas ocasiones, se puede ordenar una biopsia de hueso para descartar tipos ms graves de infecciones por hongos en los senos paranasales. TRATAMIENTO El tratamiento para la sinusitis depende de la causa y de   si la afeccin es crnica o aguda. Si lo que causa la sinusitis es un virus, los sntomas desparecern por s solos en el trmino de 10das. Podran recetarle medicamentos para  aliviar los sntomas, entre los que se incluyen los siguientes:  Descongestivos nasales tpicos. Desinflaman las fosas nasales y permiten que la mucosidad drene por los senos paranasales.  Antihistamnicos. Este tipo de medicamento bloquea la inflamacin que ocasionan las alergias. Pueden ayudar a reducir la inflamacin en la nariz y los senos.  Corticoides nasales tpicos. Son aerosoles nasales que reducen la inflamacin e hinchazn en la nariz y los senos.  Lavados nasales con solucin salina. Estos enjuagues pueden ayudar a eliminar la mucosidad espesa en la nariz. Si la afeccin es causada por una bacteria, se le recetarn antibiticos. Si es causada por un hongo, se le recetarn antimicticos. Se podra necesitar ciruga para tratar enfermedades preexistentes, como las fosas nasales estrechas. Tambin podra ser necesaria para eliminar plipos. INSTRUCCIONES PARA EL CUIDADO EN EL HOGAR Medicamentos  Tome, use o aplquese los medicamentos de venta libre y recetados solamente como se lo haya indicado el mdico. Estos pueden incluir aerosoles nasales.  Si le recetaron un antibitico, tmelo como se lo haya indicado el mdico. No deje de tomar los antibiticos aunque comience a sentirse mejor. Hidrtese y humidifique los ambientes.  Beba suficiente agua para mantener la orina clara o de color amarillo plido. Mantenerse hidratado lo ayudar a diluir la mucosidad.  Use un humidificador de vapor fro para mantener la humedad de su hogar por encima del 50%.  Realice inhalaciones de vapor por 10 a 15minutos, de 3 a 4veces al da o tal como se lo haya indicado el mdico. Puede hacer esto en el bao con el vapor del agua caliente de la ducha.  Limite la exposicin al aire fro o seco. Reposo  Descanse todo lo que pueda.  Duerma con la cabeza elevada.  Asegrese de dormir lo suficiente cada noche. Instrucciones generales  Aplquese un pao tibio y hmedo en la cara 3 o 4veces al da  o como se lo haya indicado el mdico. Esto ayuda a calmar las molestias.  Lvese las manos frecuentemente con agua y jabn para reducir la exposicin a virus y otras bacterias. Use desinfectante para manos si no dispone de agua y jabn.  No fume. Evite estar cerca de personas que fuman (fumador pasivo).  Concurra a todas las visitas de control como se lo haya indicado el mdico. Esto es importante. SOLICITE ATENCIN MDICA SI:  Tiene fiebre.  Los sntomas empeoran.  Los sntomas no mejoran en el trmino de 10das. SOLICITE ATENCIN MDICA DE INMEDIATO SI:  Tiene un dolor de cabeza intenso.  Tiene vmitos persistentes.  Tiene dolor o hinchazn en la zona del rostro o los ojos.  Tiene problemas de visin.  Se siente confundido.  Tiene el cuello rgido.  Tiene dificultad para respirar. Esta informacin no tiene como fin reemplazar el consejo del mdico. Asegrese de hacerle al mdico cualquier pregunta que tenga. Document Released: 09/23/2005 Document Revised: 04/06/2016 Document Reviewed: 10/09/2015 Elsevier Interactive Patient Education  2017 [employer].    Alergias en los adultos (Allergies, Adult) Una alergia se produce cuando el sistema de defensa del cuerpo (sistema inmunitario) reacciona en exceso a una sustancia normalmente inofensiva (alrgeno), que se respira o se come, o entra en contacto con la piel. Al estar en contacto con algo a lo que se es alrgico, el sistema inmunitario produce ciertas protenas (anticuerpos). Estas protenas hacen   que las clulas liberen sustancias qumicas (histaminas) que desencadenan los sntomas de una reaccin alrgica. Las alergias suelen afectar las fosas nasales (rinitis alrgica), los ojos (conjuntivitis alrgica), la piel (dermatitis atpica) y el estmago. Las alergias pueden ser leves o graves. No se pueden diseminar de una persona a otra (no es contagiosa). Pueden aparecer a cualquier edad y se pueden superar con los  aos. CAUSAS La causa de las alergias puede ser cualquier sustancia que el sistema inmunitario identifica errneamente como daina. Estas pueden incluir lo siguiente:  Alrgenos externos, como el polen, el pasto, las malezas, el escape de gases de automviles y esporas del moho.  Alrgenos internos, como el polvo, el humo, el moho y la caspa de las mascotas.  Alimentos, especficamente el man, la leche, los huevos, el pescado, los mariscos, la soja, las nueces y el trigo.  Medicamentos, como la penicilina.  Irritantes de la piel, como los detergentes, las sustancias qumicas y el ltex.  Perfume.  Las picaduras o mordeduras de insectos. FACTORES DE RIESGO Puede tener un riesgo mayor de sufrir alergias si otras personas de su familia tienen alergias. SNTOMAS Los sntomas dependen del tipo de alergia que tenga. Estos pueden incluir los siguientes:  Secrecin o congestin nasal.  Estornudos.  Picazn en la boca, los odos o la garganta.  Goteo posnasal.  Dolor de garganta.  Ojos rojos, lagrimosos, hinchados o con picazn.  Erupcin o ronchas en la piel.  Dolor de estmago.  Vmitos.  Diarrea.  Meteorismo.  Tos o sibilancias. Las personas con una alergia grave a los alimentos, los medicamentos o la picadura de insectos pueden tener una reaccin alrgica potencialmente mortal (anafilaxia). Los sntomas de la anafilaxia incluyen:  Ronchas.  Picazn.  Cara enrojecida.  Labios, lengua o boca hinchados.  Hinchazn u opresin en la garganta.  Dolor u opresin en el pecho.  Dificultad para respirar o falta de aire.  Latidos cardacos rpidos.  Mareos o desmayos.  Vmitos.  Diarrea.  Dolor en el abdomen. DIAGNSTICO Esta afeccin se diagnostica en funcin de lo siguiente:  Sus sntomas.  Sus antecedentes mdicos y familiares.  Un examen fsico. Es posible que necesite ver a un mdico especialista en el tratamiento de alergias (alergista). Tambin  pueden hacerle exmenes que incluyen:  Pruebas de piel para detectar qu alrgenos causan los sntomas. por ejemplo:  Prueba de puncin. En esta prueba, se pincha la piel con una aguja diminuta para exponerla a pequeas cantidades de posibles alrgenos y ver si la piel reacciona.  Prueba cutnea intradrmica. En esta prueba, se inyecta una pequea cantidad de alrgeno debajo de la piel para ver si esta reacciona.  Prueba de parche. En esta prueba, se coloca una pequea cantidad de alrgeno en la piel y luego se cubre con una venda. El mdico examinar la piel despus de un par de das para ver si hay una erupcin cutnea.  Anlisis de sangre.  Pruebas de provocacin. En esta prueba, debe inhalar una pequea cantidad de alrgeno por boca para ver si produce una reaccin alrgica. Tambin pueden pedirle que:  Lleve un registro de alimentos. Un registro de alimentos incluye todos los alimentos y las bebidas que usted consume en un da, y cualquier sntoma que experimenta.  Practique una dieta de eliminacin. Una dieta de eliminacin implica eliminar alimentos especficos de la dieta y luego incorporarlos nuevamente, uno a la vez, para averiguar si hay uno en particular que le cause una reaccin alrgica. TRATAMIENTO El tratamiento para las alergias depende de   los sntomas. El tratamiento puede incluir lo siguiente:  Compresas fras para aliviar la picazn y la hinchazn.  Gotas oftlmicas.  Aerosoles nasales.  Usar un aerosol salino o lota nasal (neti pot) para lavar la nariz (irrigacin nasal). Estos mtodos pueden ayudar a eliminar mucosidad y mantener las fosas nasales hmedas.  El uso de un humidificador.  Antihistamnicos orales u otros medicamentos para detener la reaccin alrgica y la inflamacin.  Cremas para la piel para tratar las erupciones o la picazn.  Cambios en la dieta para eliminar los desencadenantes de la alergia a los alimentos.  Exposicin repetida a  cantidades diminutas de alrgenos para generar tolerancia y prevenir reacciones alrgicas futuras (inmunoterapia). Estos incluyen los siguientes:  Vacunas contra la alergia.  Tratamiento por va oral. Esto incluye pequeas dosis de un alrgeno debajo de la lengua (inmunoterapia sublingual).  Inyeccin de epinefrina de emergencia (autoinyector) en caso de una emergencia alrgica. Se trata de un medicamento autoinyectable y medido previamente que se debe administrar en los primeros minutos de una reaccin alrgica grave. INSTRUCCIONES PARA EL CUIDADO EN EL HOGAR  Evite los alrgenos conocidos, siempre que sea posible.  Si sufre de alergia por alrgenos que estn en el aire, lvese la nariz a diario. Puede usar un aerosol o una lota nasal (neti pot) para lavar la nariz (irrigacin nasal).  Tome los medicamentos de venta libre y los recetados solamente como se lo haya indicado el mdico.  Concurra a todas las visitas de control como se lo haya indicado el mdico. Esto es importante.  Si tiene riesgo de sufrir una reaccin alrgica grave (anafilaxia), lleve su autoinyector con usted en todo momento.  Si alguna vez ha tenido anafilaxia, use una pulsera o collar de alerta mdica que diga que usted tiene una alergia grave. SOLICITE ATENCIN MDICA SI:  Los sntomas no mejoran con el tratamiento. SOLICITE ATENCIN MDICA DE INMEDIATO SI:  Tiene sntomas de anafilaxia, como los siguientes:  Boca, lengua o garganta hinchadas.  Dolor u opresin en el pecho.  Dificultad para respirar o falta de aire.  Mareos o desmayos.  Dolor abdominal intenso, vmitos o diarrea. Esta informacin no tiene como fin reemplazar el consejo del mdico. Asegrese de hacerle al mdico cualquier pregunta que tenga. Document Released: 12/14/2005 Document Revised: 01/04/2015 Document Reviewed: 07/01/2016 Elsevier Interactive Patient Education  2017 [employer]vier Inc.   IF you received an x-ray today, you will receive  an invoice from [facility]. Please contact [facility] at [phone] with questions or concerns regarding your invoice.   IF you received labwork today, you will receive an invoice from [facility]. Please contact [facility] at [phone] with questions or concerns regarding your invoice.   Our billing staff will not be able to assist you with questions regarding bills from these companies.  You will be contacted with the lab results as soon as they are available. The fastest way to get your results is to activate your My Chart account. Instructions are located on the last page of this paperwork. If you have not heard from us regarding the results in 2 weeks, please contact this office.      

## 2017-03-30 NOTE — Progress Notes (Signed)
   MRN: [id number] DOB: Aug 01, 1986  Subjective:   Fina Heizer is a 31 y.o. female presenting for chief complaint of Headache (X 1 mth) and Facial Pain  Reports 1 month history of persistent right sided sinus pain, facial pain, nasal congestion, right red eye and intermittent itching, sore throat. Also had fevers and productive cough albeit now these symptoms have improved. Has tried otc medications including DayQuil. Admits history of seasonal allergies, not taking anything for this currently. Denies ear pain, ear drainage, chest pain, shob, abdominal pain, rashes. Denies smoking cigarettes. Denies history of asthma. Has not had her flu shot this past season.  Karrine is not currently taking any medications. Also has No Known Allergies. Keysi  has a past medical history of Medical history non-contributory and Varicosities. Also denies past surgical history.   Objective:   Vitals: BP 133/86 (BP Location: Right Arm, Patient Position: Sitting, Cuff Size: Small)   Pulse 73   Temp 98.6 F (37 C) (Oral)   Ht  (1.499 m)   Wt 120 lb 6.4 oz (54.6 kg)   SpO2 98%   Breastfeeding? No   BMI 24.32 kg/m   Physical Exam  Constitutional: She is oriented to person, place, and time. She appears well-developed and well-nourished.  HENT:  TM's intact bilaterally, no effusions or erythema. Nasal turbinates dry and erythematous, nasal passages patent. Right maxillary sinus tenderness. Oropharynx clear, mucous membranes moist, dentition in good repair.  Eyes: Right eye exhibits no discharge. Left eye exhibits no discharge.  Neck: Normal range of motion. Neck supple.  Cardiovascular: Normal rate, regular rhythm and intact distal pulses.  Exam reveals no gallop and no friction rub.   No murmur heard. Pulmonary/Chest: No respiratory distress. She has no wheezes. She has no rales.  Lymphadenopathy:    She has no cervical adenopathy.  Neurological: She is alert and oriented to person, place,  and time.  Skin: Skin is warm and dry.  Psychiatric: She has a normal mood and affect.   Assessment and Plan :   1. Acute non-recurrent maxillary sinusitis 2. Sinus headache 3. Sinus pain - Start amoxicillin to address sinusitis secondary to uncontrolled allergies. Use supportive care otherwise. RTC in 1 week if no improvement.  4. Environmental allergies - Start consistent allergy management including her nasal steroid, Nasonex.  - cetirizine (ZYRTEC) 10 MG tablet; Take 1 tablet (10 mg total) by mouth daily.  Dispense: 30 tablet; Refill: 11 - pseudoephedrine (SUDAFED 12 HOUR) 120 MG 12 hr tablet; Take 1 tablet (120 mg total) by mouth 2 (two) times daily.  Dispense: 30 tablet; Refill: 3   Wallis Bamberg, [place] [facility] [phone] 03/30/2017  11:01 AM

## 2020-10-24 ENCOUNTER — Other Ambulatory Visit: Payer: Self-pay | Admitting: Otolaryngology

## 2020-10-24 DIAGNOSIS — E041 Nontoxic single thyroid nodule: Secondary | ICD-10-CM

## 2020-10-24 DIAGNOSIS — R221 Localized swelling, mass and lump, neck: Secondary | ICD-10-CM

## 2020-11-11 ENCOUNTER — Ambulatory Visit
Admission: RE | Admit: 2020-11-11 | Discharge: 2020-11-11 | Disposition: A | Discharge status: Home / Self Care | Payer: Self-pay | Source: Ambulatory Visit | Attending: Otolaryngology | Admitting: Otolaryngology

## 2020-11-11 DIAGNOSIS — R221 Localized swelling, mass and lump, neck: Secondary | ICD-10-CM

## 2020-11-11 DIAGNOSIS — E041 Nontoxic single thyroid nodule: Secondary | ICD-10-CM

## 2021-04-05 IMAGING — US US THYROID
1 series · 14 of 25 positions shown · non-contrast
Comparison: None.

CLINICAL DATA: Right neck swelling. Throat discomfort while eating.

EXAM:
THYROID ULTRASOUND
TECHNIQUE: Ultrasound examination of the thyroid gland and adjacent soft
tissues was performed.

[Series 1: us thyroid · 0.05mm/px · 14 of 47 slices shown]
[im 1/47]
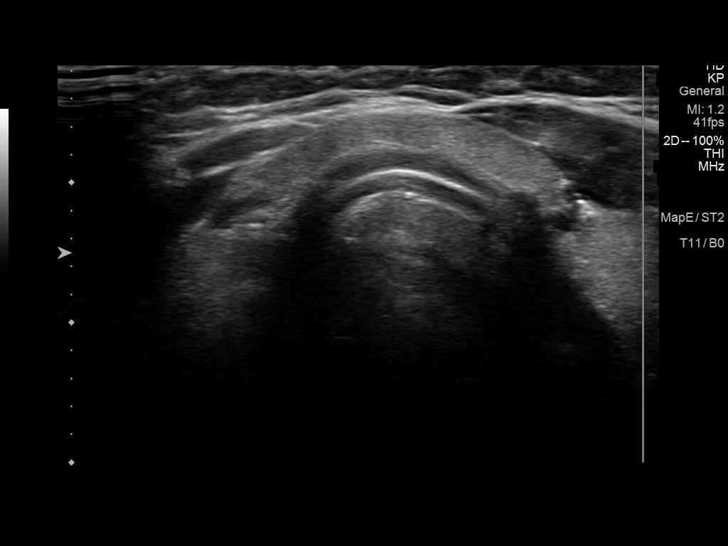
[im 4/47]
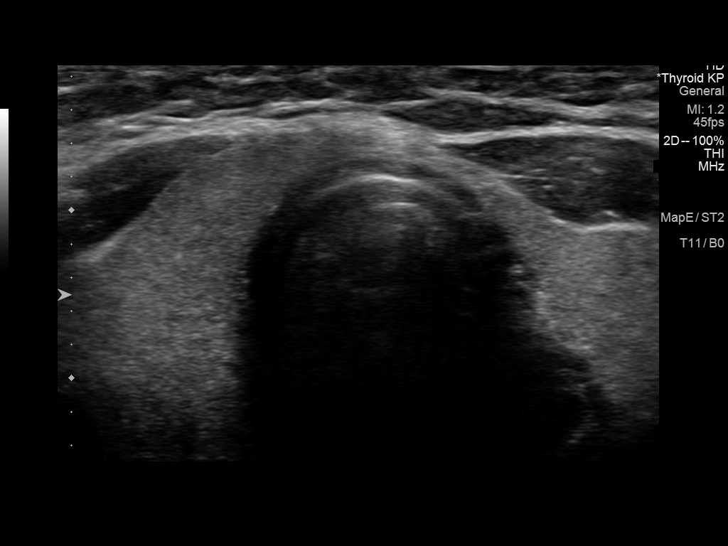
[im 8/47]
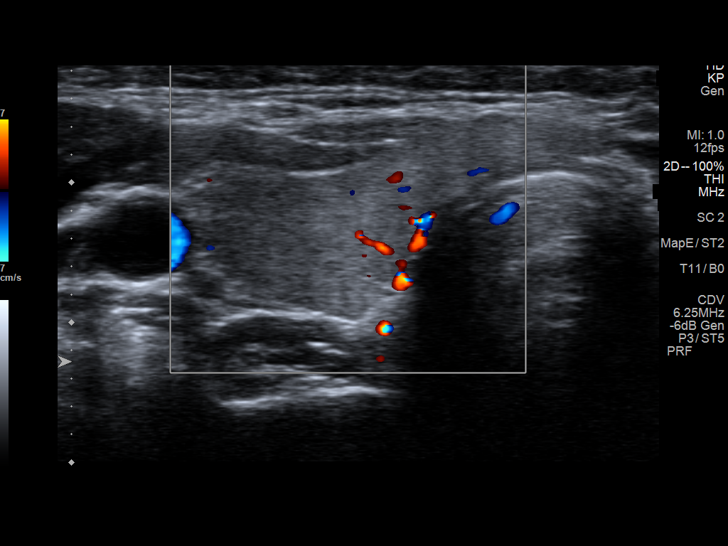
[im 12/47]
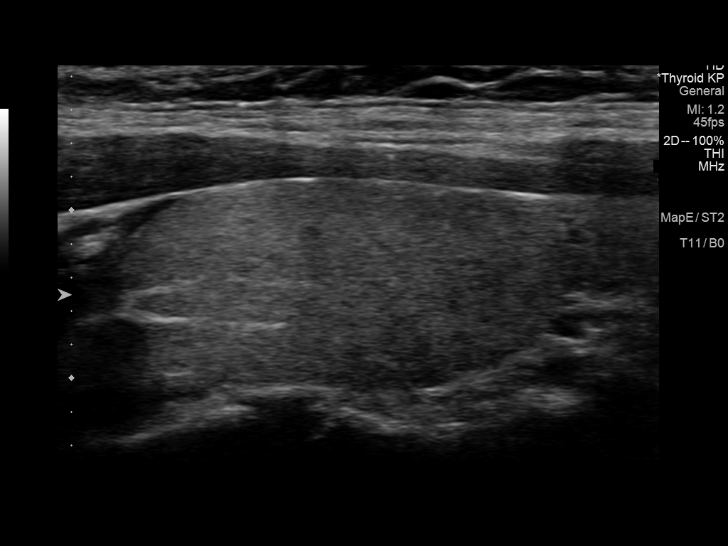
[im 16/47]
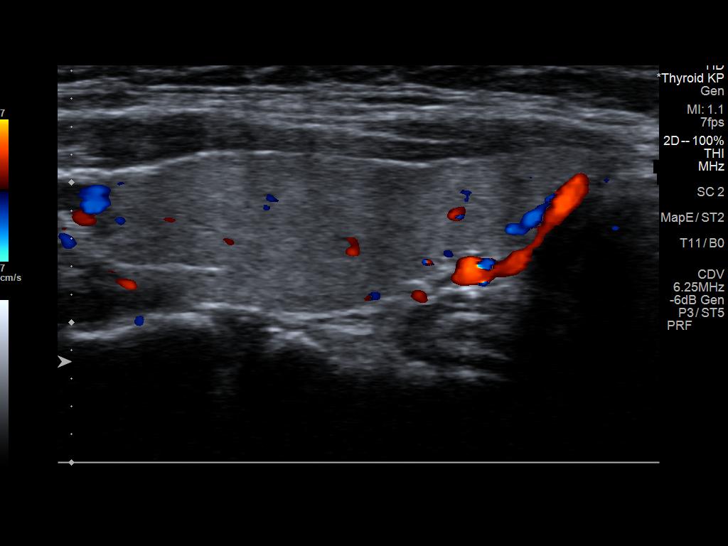
[im 18/47]
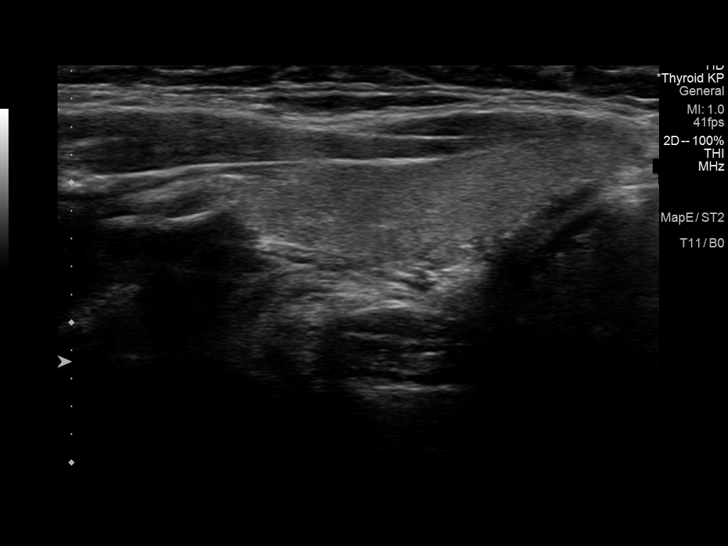
[im 22/47]
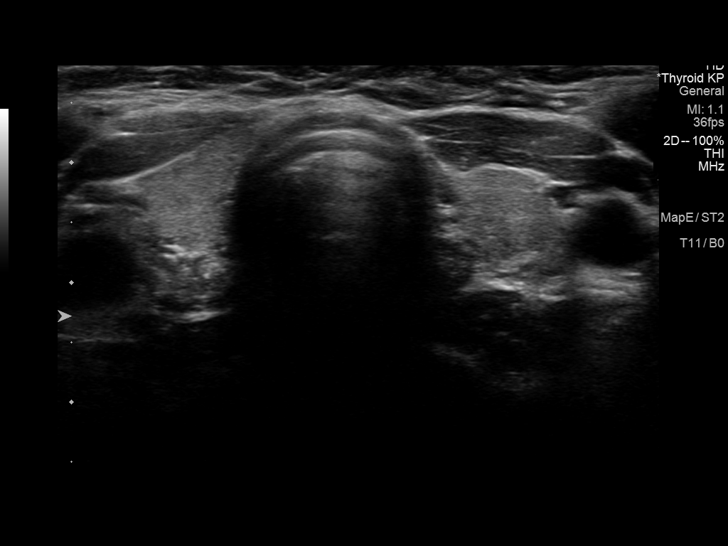
[im 25/47]
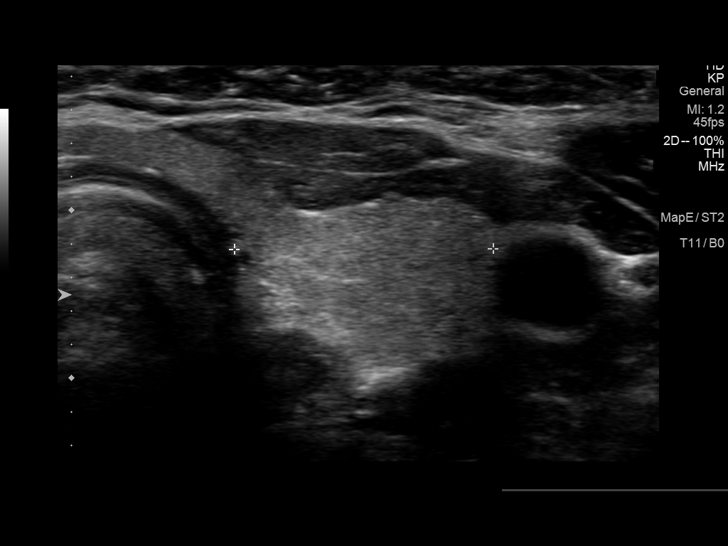
[im 29/47]
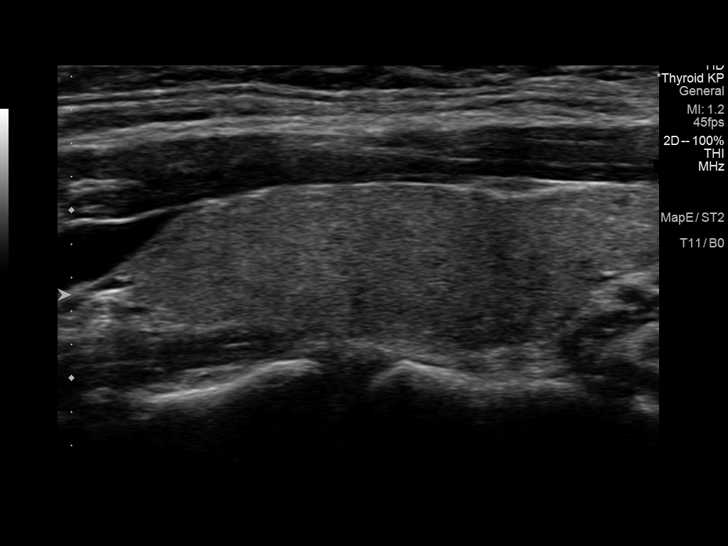
[im 31/47]
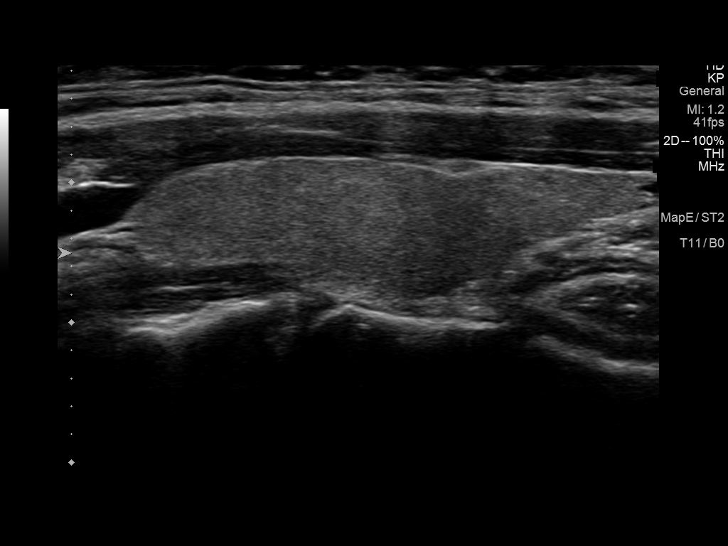
[im 35/47]
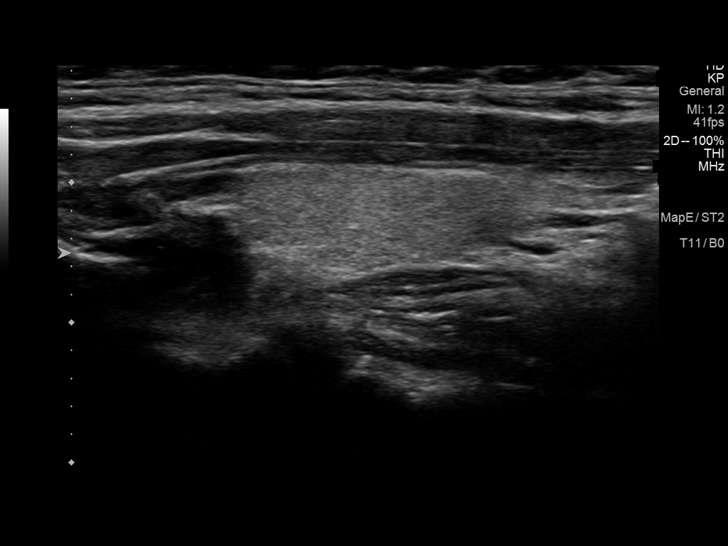
[im 39/47]
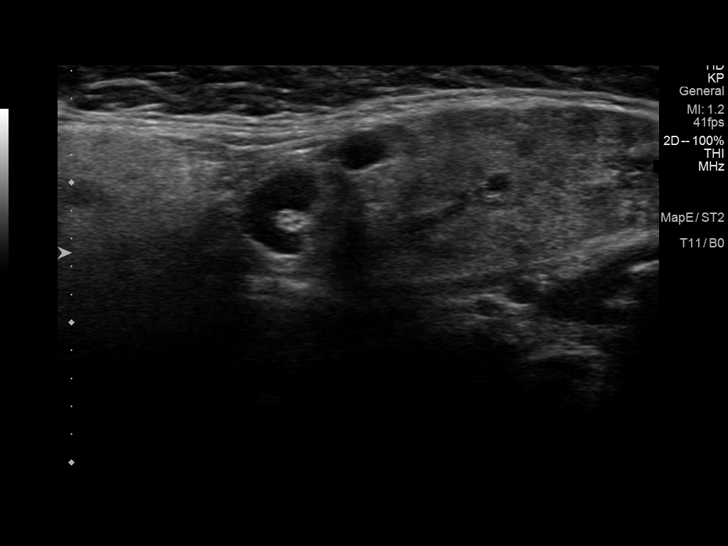
[im 43/47]
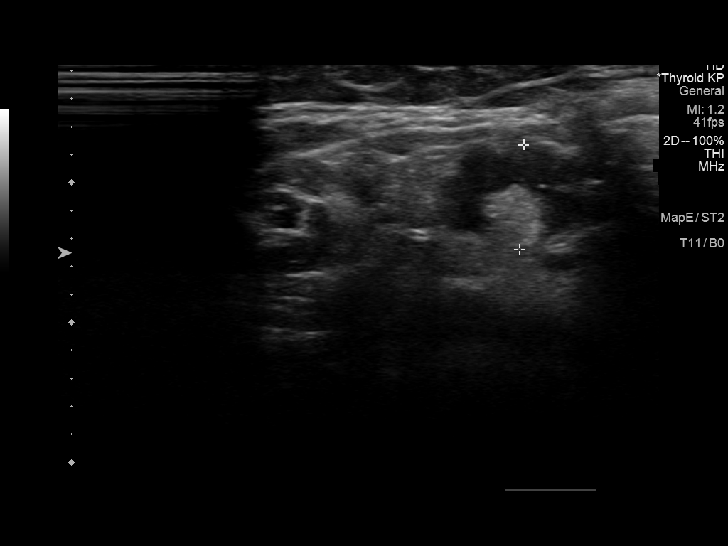
[im 47/47]
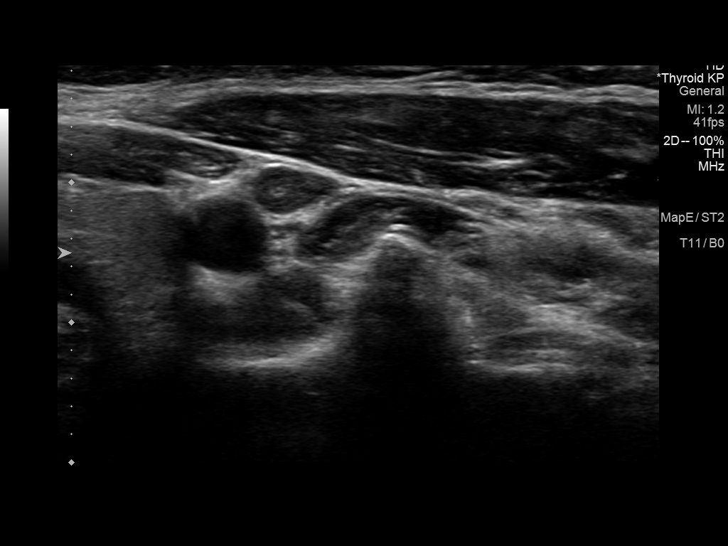

[14 of 25 positions shown; findings below may reference images not displayed]

FINDINGS: Parenchymal Echotexture: Normal

Isthmus: 0.3 cm

Right lobe: 3.8 x 1.1 x 1.7 cm

Left lobe: 3.8 x 1.0 x 1.5 cm

_________________________________________________________

Estimated total number of nodules >/= 1 cm: 0

Number of spongiform nodules >/=  2 cm not described below (TR1): 0

Number of mixed cystic and solid nodules >/= 1.5 cm not described
below (TR2): 0

_________________________________________________________

No discrete nodules are seen within the thyroid gland. Small normal
appearing lymph nodes on both sides of the neck.
IMPRESSION: Normal thyroid ultrasound.

## 2022-08-06 ENCOUNTER — Other Ambulatory Visit: Payer: Self-pay

## 2022-08-06 ENCOUNTER — Emergency Department (HOSPITAL_COMMUNITY)
Admission: EM | Admit: 2022-08-06 | Discharge: 2022-08-07 | Disposition: A | Discharge status: Home / Self Care | Payer: Self-pay | Attending: Emergency Medicine | Admitting: Emergency Medicine

## 2022-08-06 ENCOUNTER — Encounter (HOSPITAL_COMMUNITY): Payer: Self-pay | Admitting: Emergency Medicine

## 2022-08-06 DIAGNOSIS — W540XXA Bitten by dog, initial encounter: Secondary | ICD-10-CM | POA: Insufficient documentation

## 2022-08-06 DIAGNOSIS — S71151A Open bite, right thigh, initial encounter: Secondary | ICD-10-CM | POA: Insufficient documentation

## 2022-08-06 DIAGNOSIS — S61552A Open bite of left wrist, initial encounter: Secondary | ICD-10-CM | POA: Insufficient documentation

## 2022-08-06 MED ORDER — ACETAMINOPHEN 500 MG PO TABS
1000.0000 mg | ORAL_TABLET | Freq: Once | ORAL | Status: AC
  Administered 2022-08-06: 1000 mg via ORAL
  Filled 2022-08-06: qty 2

## 2022-08-06 NOTE — ED Provider Triage Note (Signed)
Emergency Medicine Provider Triage Evaluation Note  Philomena Buttermore , a 36 y.o. female  was evaluated in triage.  Pt complains of dog bite to L arm and R leg earlier this evening. Bit by own dog, up to date on vaccines. Unknown last tetanus.   Review of Systems  Positive: wounds Negative:   Physical Exam  BP (!) 168/102 (BP Location: Right Arm)   Pulse 84   Temp 98.1 F (36.7 C) (Oral)   Resp 15   SpO2 100%  Gen:   Awake, no distress   Resp:  Normal effort  MSK:   Moves extremities without difficulty  Other:  Puncture wounds to left forearm and right thigh, bleeding controlled  Medical Decision Making  Medically screening exam initiated at 9:47 PM.  Appropriate orders placed.  Anacaren Kohan was informed that the remainder of the evaluation will be completed by another provider, this initial triage assessment does not replace that evaluation, and the importance of remaining in the ED until their evaluation is complete.     Su Monks, PA-C 08/06/22 2148

## 2022-08-06 NOTE — ED Triage Notes (Signed)
Patient presents with dog bite at left distal forearm and right anterior thigh this evening , dog's immunizations are complete / minimal bleeding .

## 2022-08-07 ENCOUNTER — Emergency Department (HOSPITAL_COMMUNITY): Payer: Self-pay

## 2022-08-07 MED ORDER — AMOXICILLIN-POT CLAVULANATE 875-125 MG PO TABS: 1.0000 | ORAL_TABLET | Freq: Two times a day (BID) | ORAL | 0 refills | Status: DC

## 2022-08-07 MED ORDER — ACETAMINOPHEN 325 MG PO TABS: 650.0000 mg | ORAL_TABLET | Freq: Once | ORAL | Status: DC

## 2022-08-07 MED ORDER — TETANUS-DIPHTH-ACELL PERTUSSIS 5-2.5-18.5 LF-MCG/0.5 IM SUSY: 0.5000 mL | PREFILLED_SYRINGE | Freq: Once | INTRAMUSCULAR | Status: DC

## 2022-08-07 NOTE — ED Provider Notes (Signed)
[place] EMERGENCY DEPARTMENT Provider Note   CSN: [id number] Arrival date & time: 08/06/22  2027     History {Add pertinent medical, surgical, social history, OB history to HPI:1} Chief Complaint  Patient presents with   Dog Bite    Jillian Turner is a 36 y.o. female.  Patient as above with significant medical history as below, including no significant medical history who presents to the ED with complaint of dog bite.  Patient is RHD.  She was bitten by her own dog.  Dog is up-to-date on rabies vaccine.  Patient reports dog was acting normally, they were playing and the dog appears to have accidentally bitten her.  They did briefly clean the wound prior to arrival.  She has wound to her left wrist and right proximal thigh.  No ongoing bleeding.  No numbness or tingling.  No nausea or vomiting.  No rashes.  No chest pain, dyspnea, abdominal pain.  Patient reports that she was in normal state of health prior to injury.  No other acute complaints offered.     Past Medical History:  Diagnosis Date   Medical history non-contributory    Varicosities    bilateral legs    Past Surgical History:  Procedure Laterality Date   NO PAST SURGERIES       The history is provided by the patient and the spouse. The history is limited by a language barrier. Language interpreter used: offered translator but prefers to have spouse translate for her.       Home Medications Prior to Admission medications   Medication Sig Start Date End Date Taking? Authorizing Provider  amoxicillin-clavulanate (AUGMENTIN) 875-125 MG tablet Take 1 tablet by mouth every 12 (twelve) hours. 08/07/22  Yes Wynona Dove A, DO  amoxicillin (AMOXIL) 500 MG capsule Take 1 capsule (500 mg total) by mouth 2 (two) times daily. 03/30/17   Jaynee Eagles, PA-C  cetirizine (ZYRTEC) 10 MG tablet Take 1 tablet (10 mg total) by mouth daily. 03/30/17   Jaynee Eagles, PA-C  pseudoephedrine (SUDAFED 12 HOUR) 120 MG 12  hr tablet Take 1 tablet (120 mg total) by mouth 2 (two) times daily. 03/30/17   Jaynee Eagles, PA-C      Allergies    Patient has no known allergies.    Review of Systems   Review of Systems  Constitutional:  Negative for activity change and fever.  HENT:  Negative for facial swelling and trouble swallowing.   Eyes:  Negative for discharge and redness.  Respiratory:  Negative for cough and shortness of breath.   Cardiovascular:  Negative for chest pain and palpitations.  Gastrointestinal:  Negative for abdominal pain and nausea.  Genitourinary:  Negative for dysuria and flank pain.  Musculoskeletal:  Negative for back pain and gait problem.  Skin:  Positive for wound. Negative for pallor and rash.  Neurological:  Negative for syncope and headaches.    Physical Exam Updated Vital Signs BP (!) 107/92 (BP Location: Right Arm)   Pulse 74   Temp 98.4 F (36.9 C)   Resp 16   SpO2 100%  Physical Exam Vitals and nursing note reviewed.  Constitutional:      General: She is not in acute distress.    Appearance: Normal appearance.  HENT:     Head: Normocephalic and atraumatic.     Right Ear: External ear normal.     Left Ear: External ear normal.     Nose: Nose normal.  Mouth/Throat:     Mouth: Mucous membranes are moist.  Eyes:     General: No scleral icterus.       Right eye: No discharge.        Left eye: No discharge.  Cardiovascular:     Rate and Rhythm: Normal rate and regular rhythm.     Pulses: Normal pulses.     Heart sounds: Normal heart sounds.  Pulmonary:     Effort: Pulmonary effort is normal. No respiratory distress.     Breath sounds: Normal breath sounds.  Abdominal:     General: Abdomen is flat.     Tenderness: There is no abdominal tenderness.  Musculoskeletal:        General: Normal range of motion.     Cervical back: Normal range of motion.     Right lower leg: No edema.     Left lower leg: No edema.  Skin:    General: Skin is warm and dry.      Capillary Refill: Capillary refill takes less than 2 seconds.          Comments: Affected extremity is neurovascularly intact to radial, ulnar and median nerve distributions.  2 Plus radial pulse.  Capillary refill is brisk to fingertips  Neurological:     Mental Status: She is alert and oriented to person, place, and time.     GCS: GCS eye subscore is 4. GCS verbal subscore is 5. GCS motor subscore is 6.  Psychiatric:        Mood and Affect: Mood normal.        Behavior: Behavior normal.     ED Results / Procedures / Treatments   Labs (all labs ordered are listed, but only abnormal results are displayed) Labs Reviewed - No data to display  EKG None  Radiology No results found.  Procedures Procedures  {Document cardiac monitor, telemetry assessment procedure when appropriate:1}  Medications Ordered in ED Medications  acetaminophen (TYLENOL) tablet 650 mg (has no administration in time range)  acetaminophen (TYLENOL) tablet 1,000 mg (1,000 mg Oral Given 08/06/22 2151)    ED Course/ Medical Decision Making/ A&P                           Medical Decision Making Amount and/or Complexity of Data Reviewed Radiology: ordered.  Risk OTC drugs. Prescription drug management.   36 year old female to the ED with dog bite.  This was her own dog.  Dog up-to-date on immunizations.  Hemostasis obtained prior to arrival.  Physical exam is reassuring, no evidence of neurovascular injury associated with the bites.  Will obtain x-ray to assess for foreign body.  Will clean the wound copiously, update tetanus status.  Start oral antibiotics.  X-ray ordered, I reviewed images, I agree with radiology interpretation. ***  The patient improved significantly and was discharged in stable condition. Detailed discussions were had with the patient regarding current findings, and need for close f/u with PCP or on call doctor. The patient has been instructed to return immediately if the symptoms  worsen in any way for re-evaluation. Patient verbalized understanding and is in agreement with current care plan. All questions answered prior to discharge.    {Document critical care time when appropriate:1} {Document review of labs and clinical decision tools ie heart score, Chads2Vasc2 etc:1}  {Document your independent review of radiology images, and any outside records:1} {Document your discussion with family members, caretakers, and with consultants:1} {Document social determinants  of health affecting pt's care:1} {Document your decision making why or why not admission, treatments were needed:1} Final Clinical Impression(s) / ED Diagnoses Final diagnoses:  Dog bite, initial encounter    Rx / DC Orders ED Discharge Orders          Ordered    amoxicillin-clavulanate (AUGMENTIN) 875-125 MG tablet  Every 12 hours        08/07/22 0609

## 2022-08-07 NOTE — Discharge Instructions (Addendum)
It was a pleasure caring for you today in the emergency department. ° °Please return to the emergency department for any worsening or worrisome symptoms. ° ° °

## 2022-12-28 NOTE — L&D Delivery Note (Addendum)
OB/GYN Faculty Practice Delivery Note  Jillian Turner is a 37 y.o. [id number] s/p VD at [redacted]w[redacted]d. She was admitted for IOL for BPP 4/8.   ROM: 7h 60m with Clear fluid GBS Status: Negative/-- (07/16 1025) Maximum Maternal Temperature: 98.4  Labor Progress: Initial SVE: closed/thick/-3. She then progressed to complete.   Delivery Date/Time: 08/11/23 - 0725 Delivery: Called to room and patient was complete and pushing. Head delivered LOA. No nuchal cord present. Shoulder and body delivered in usual fashion. Infant with spontaneous cry, placed on mother's abdomen, dried and stimulated. Cord clamped x 2 after 1-minute delay, and cut by father. Cord blood drawn. Placenta delivered spontaneously with gentle cord traction. Fundus firm with massage and Pitocin. Labia, perineum, vagina, and cervix inspected with no tears.  Baby Weight: pending  Placenta: 3 vessel, intact. Sent to L&D Complications: None Lacerations:  EBL: 247 mL Analgesia: Epidural   Infant:  APGAR (1 MIN): 9 APGAR (5 MINS): 9  Olga Millers, DO PGY-3 Family Medicine 08/11/2023, 7:39 AM   Fellow ATTESTATION  I was present and gloved for this delivery and agree with the above documentation in the resident's note except as below.   Celedonio Savage, MD [facility] (Faculty Practice) 08/11/2023, 8:03 AM

## 2023-02-02 ENCOUNTER — Other Ambulatory Visit: Payer: Self-pay

## 2023-02-02 ENCOUNTER — Encounter (HOSPITAL_COMMUNITY): Payer: Self-pay

## 2023-02-02 ENCOUNTER — Inpatient Hospital Stay (HOSPITAL_COMMUNITY)
Admission: AD | Admit: 2023-02-02 | Discharge: 2023-02-02 | Disposition: A | Discharge status: Home / Self Care | Payer: Self-pay | Attending: Obstetrics & Gynecology | Admitting: Obstetrics & Gynecology

## 2023-02-02 DIAGNOSIS — Z3491 Encounter for supervision of normal pregnancy, unspecified, first trimester: Secondary | ICD-10-CM

## 2023-02-02 DIAGNOSIS — O26891 Other specified pregnancy related conditions, first trimester: Secondary | ICD-10-CM | POA: Insufficient documentation

## 2023-02-02 DIAGNOSIS — Z8759 Personal history of other complications of pregnancy, childbirth and the puerperium: Secondary | ICD-10-CM

## 2023-02-02 DIAGNOSIS — Z3A13 13 weeks gestation of pregnancy: Secondary | ICD-10-CM | POA: Insufficient documentation

## 2023-02-02 DIAGNOSIS — N854 Malposition of uterus: Secondary | ICD-10-CM

## 2023-02-02 DIAGNOSIS — N8110 Cystocele, unspecified: Secondary | ICD-10-CM | POA: Insufficient documentation

## 2023-02-02 DIAGNOSIS — O3481 Maternal care for other abnormalities of pelvic organs, first trimester: Secondary | ICD-10-CM | POA: Insufficient documentation

## 2023-02-02 LAB — URINALYSIS, ROUTINE W REFLEX MICROSCOPIC
Bilirubin Urine: NEGATIVE
Glucose, UA: NEGATIVE mg/dL
Hgb urine dipstick: NEGATIVE
Ketones, ur: NEGATIVE mg/dL
Leukocytes,Ua: NEGATIVE
Nitrite: NEGATIVE
Protein, ur: NEGATIVE mg/dL
Specific Gravity, Urine: 1.012 (ref 1.005–1.030)
pH: 7 (ref 5.0–8.0)

## 2023-02-02 LAB — POCT PREGNANCY, URINE: Preg Test, Ur: POSITIVE — AB

## 2023-02-02 NOTE — Discharge Instructions (Signed)
Prenatal Care Providers           [facility]  [address] [phone]  [facility]   [address]  [phone]  [facility] [place]       [address] [phone]            [facility] @ [place]     [address] [phone]          [facility] [place]   [address] [phone]  [facility]  [address] [phone]     [facility]  [address]   [phone]     [facility]  Phone: [phone]  [facility]  Phone: [phone]  [facility] Phone: [phone]  [facility] Phone: [phone]  [facility] Phone: [phone]  [facility] Phone: [phone]  [facility]  Phone: [phone]  

## 2023-02-02 NOTE — MAU Note (Signed)
Jillian Turner is a 37 y.o. at [redacted]w[redacted]d here in MAU reporting: recently has been having a lot of back pain and today was having lower abdominal pain with walking. Having some urinary urgency and frequency with urination. Today is feeling like something is stuck in her vagina and that it might come out when she walks, felt a little bulge. No pain currently.   LMP: 11/02/22  Onset of complaint: ongoing  Pain score: 0/10  Vitals:   02/02/23 1759  BP: 137/88  Pulse: 93  Resp: 16  Temp: 98.6 F (37 C)  SpO2: 100%     FHT:NA  Lab orders placed from triage: ua ,upt

## 2023-02-02 NOTE — MAU Provider Note (Signed)
History     CSN: [id number]  Arrival date and time: 02/02/23 1706   Event Date/Time   First Provider Initiated Contact with Patient 02/02/23 2105      Chief Complaint  Patient presents with   Urinary Frequency   Urinary Urgency   Urinary Frequency  Associated symptoms include frequency. Pertinent negatives include no chills, flank pain, nausea or vomiting.   [id number] at [redacted]w[redacted]d presenting for vaginal pressure. Reports increased urinary frequency and urgency but no pain. Denies abdominal pain. Denies bleeding. Has a sure LMP.   OB History     Gravida  3   Para  2   Term  2   Preterm      AB      Living  2      SAB      IAB      Ectopic      Multiple  0   Live Births  1           Past Medical History:  Diagnosis Date   Medical history non-contributory    Varicosities    bilateral legs    Past Surgical History:  Procedure Laterality Date   NO PAST SURGERIES      Family History  Problem Relation Age of Onset   Kidney disease Brother     Social History   Tobacco Use   Smoking status: Never   Smokeless tobacco: Never  Substance Use Topics   Alcohol use: No   Drug use: No    Allergies: No Known Allergies  Medications Prior to Admission  Medication Sig Dispense Refill Last Dose   amoxicillin (AMOXIL) 500 MG capsule Take 1 capsule (500 mg total) by mouth 2 (two) times daily. 14 capsule 0    amoxicillin-clavulanate (AUGMENTIN) 875-125 MG tablet Take 1 tablet by mouth every 12 (twelve) hours. 14 tablet 0    cetirizine (ZYRTEC) 10 MG tablet Take 1 tablet (10 mg total) by mouth daily. 30 tablet 11    pseudoephedrine (SUDAFED 12 HOUR) 120 MG 12 hr tablet Take 1 tablet (120 mg total) by mouth 2 (two) times daily. 30 tablet 3     Review of Systems  Constitutional:  Negative for chills and fever.  HENT:  Negative for congestion and sore throat.   Eyes:  Negative for pain and visual disturbance.  Respiratory:  Negative for cough, chest tightness  and shortness of breath.   Cardiovascular:  Negative for chest pain.  Gastrointestinal:  Negative for abdominal pain, diarrhea, nausea and vomiting.  Endocrine: Negative for cold intolerance and heat intolerance.  Genitourinary:  Positive for frequency. Negative for dysuria and flank pain.  Musculoskeletal:  Negative for back pain.  Skin:  Negative for rash.  Allergic/Immunologic: Negative for food allergies.  Neurological:  Negative for dizziness and light-headedness.  Psychiatric/Behavioral:  Negative for agitation.    Physical Exam   Blood pressure 137/88, pulse 93, temperature 98.6 F (37 C), temperature source Oral, resp. rate 16, weight 60.5 kg, last menstrual period 11/02/2022, SpO2 100 %.  Physical Exam Vitals and nursing note reviewed. Exam conducted with a chaperone present.  Constitutional:      General: She is not in acute distress.    Appearance: She is well-developed.  HENT:     Head: Normocephalic and atraumatic.  Eyes:     General: No scleral icterus.    Conjunctiva/sclera: Conjunctivae normal.  Cardiovascular:     Rate and Rhythm: Normal rate.  Pulmonary:     Effort:  Pulmonary effort is normal.  Chest:     Chest wall: No tenderness.  Abdominal:     Palpations: Abdomen is soft.     Tenderness: There is no abdominal tenderness. There is no guarding or rebound.     Hernia: There is no hernia in the left inguinal area or right inguinal area.  Genitourinary:    Exam position: Lithotomy position.     Pubic Area: No rash or pubic lice.      Labia:        Right: No rash.        Left: No rash.      Urethra: No prolapse.     Vagina: Normal. No vaginal discharge, tenderness or prolapsed vaginal walls.     Cervix: Normal.     Uterus: Normal. Enlarged (~13wk). Not tender.      Adnexa: Right adnexa normal and left adnexa normal.     Comments: Uterus appears retroflexed.  Musculoskeletal:        General: Normal range of motion.     Cervical back: Normal range of  motion and neck supple.  Skin:    General: Skin is warm and dry.     Findings: No rash.  Neurological:     Mental Status: She is alert and oriented to person, place, and time.     MAU Course  Procedures  Bedside US performed Discussed limited nature of [place]. Reviewed this is to see fetal HR as we were unable to get via doppler.  Used Doppler mode for < 10 seconds to determine HFR.  No formal measurements were obtained but anecdotally fetus appears consistent with 13 weeks  MDM Low  Reviewed UA that showed no abnormalities and lack of dysuria I will need run a urine culture at this time.   Assessment and Plan   1. Viable pregnancy in first trimester   2. Retroflexion of uterus   3. Prolapse of anterior vaginal wall    Reassured about her pressures sensation likely being her retroflexed uterus Provided return precautions Recommended establishing prenatla care Has appt at [facility] next week   Caren Macadam 02/02/2023, 9:05 PM

## 2023-04-09 ENCOUNTER — Other Ambulatory Visit: Payer: Self-pay | Admitting: Family Medicine

## 2023-04-09 DIAGNOSIS — Z3481 Encounter for supervision of other normal pregnancy, first trimester: Secondary | ICD-10-CM

## 2023-04-12 ENCOUNTER — Other Ambulatory Visit (INDEPENDENT_AMBULATORY_CARE_PROVIDER_SITE_OTHER): Payer: Self-pay

## 2023-04-12 DIAGNOSIS — Z3481 Encounter for supervision of other normal pregnancy, first trimester: Secondary | ICD-10-CM

## 2023-04-13 LAB — CBC/D/PLT+RPR+RH+ABO+RUBIGG...
Basophils Absolute: 0 10*3/uL (ref 0.0–0.2)
Bilirubin, UA: NEGATIVE
Glucose, UA: NEGATIVE
Immature Granulocytes: 1 %
Ketones, UA: NEGATIVE
MCHC: 32.3 g/dL (ref 31.5–35.7)
MCV: 89 fL (ref 79–97)
RDW: 12.5 % (ref 11.7–15.4)
Rh Factor: POSITIVE
WBC: 11.6 10*3/uL — ABNORMAL HIGH (ref 3.4–10.8)

## 2023-04-13 LAB — HCV INTERPRETATION

## 2023-04-13 LAB — HGB FRACTIONATION CASCADE

## 2023-04-13 LAB — MICROSCOPIC EXAMINATION: Bacteria, UA: NONE SEEN

## 2023-04-14 LAB — CBC/D/PLT+RPR+RH+ABO+RUBIGG...
Antibody Screen: NEGATIVE
HCV Ab: NONREACTIVE
Immature Grans (Abs): 0.1 10*3/uL (ref 0.0–0.1)
MCH: 28.8 pg (ref 26.6–33.0)
Nitrite, UA: NEGATIVE
Protein,UA: NEGATIVE
Specific Gravity, UA: 1.022 (ref 1.005–1.030)
pH, UA: 7 (ref 5.0–7.5)

## 2023-04-14 LAB — MICROSCOPIC EXAMINATION
Casts: NONE SEEN /LPF
WBC, UA: NONE SEEN /HPF (ref 0–5)

## 2023-04-15 LAB — CBC/D/PLT+RPR+RH+ABO+RUBIGG...
EOS (ABSOLUTE): 0.2 10*3/uL (ref 0.0–0.4)
Hematocrit: 33.7 % — ABNORMAL LOW (ref 34.0–46.6)
Hepatitis B Surface Ag: NEGATIVE
Leukocytes,UA: NEGATIVE
Lymphs: 17 %
Monocytes Absolute: 1 10*3/uL — ABNORMAL HIGH (ref 0.1–0.9)
Monocytes: 9 %
Neutrophils Absolute: 8.4 10*3/uL — ABNORMAL HIGH (ref 1.4–7.0)
RBC: 3.79 x10E6/uL (ref 3.77–5.28)
Urobilinogen, Ur: 0.2 mg/dL (ref 0.2–1.0)

## 2023-04-15 LAB — HGB FRACTIONATION CASCADE: Hgb A2: 2.9 % (ref 1.8–3.2)

## 2023-04-17 LAB — HGB FRACTIONATION CASCADE: Hgb A: 97.1 % (ref 96.4–98.8)

## 2023-04-17 LAB — MICROSCOPIC EXAMINATION

## 2023-04-17 LAB — CBC/D/PLT+RPR+RH+ABO+RUBIGG...
Basos: 0 %
Eos: 2 %
Hemoglobin: 10.9 g/dL — ABNORMAL LOW (ref 11.1–15.9)
Lymphocytes Absolute: 1.9 10*3/uL (ref 0.7–3.1)
Neutrophils: 71 %
Platelets: 386 10*3/uL (ref 150–450)
RBC, UA: NEGATIVE
RPR Ser Ql: NONREACTIVE
Rubella Antibodies, IGG: 8.43 index (ref >0.99)

## 2023-04-17 LAB — URINE CULTURE, OB REFLEX

## 2023-04-19 ENCOUNTER — Ambulatory Visit (INDEPENDENT_AMBULATORY_CARE_PROVIDER_SITE_OTHER): Payer: Self-pay | Admitting: Family Medicine

## 2023-04-19 ENCOUNTER — Other Ambulatory Visit (HOSPITAL_COMMUNITY)
Admission: RE | Admit: 2023-04-19 | Discharge: 2023-04-19 | Disposition: A | Discharge status: Home / Self Care | Payer: Self-pay | Source: Ambulatory Visit | Attending: Family Medicine | Admitting: Family Medicine

## 2023-04-19 VITALS — BP 121/76 | HR 96 | Wt 139.4 lb

## 2023-04-19 DIAGNOSIS — E663 Overweight: Secondary | ICD-10-CM

## 2023-04-19 DIAGNOSIS — Z3482 Encounter for supervision of other normal pregnancy, second trimester: Secondary | ICD-10-CM

## 2023-04-19 DIAGNOSIS — Z113 Encounter for screening for infections with a predominantly sexual mode of transmission: Secondary | ICD-10-CM

## 2023-04-19 DIAGNOSIS — Z3483 Encounter for supervision of other normal pregnancy, third trimester: Secondary | ICD-10-CM | POA: Insufficient documentation

## 2023-04-19 DIAGNOSIS — O0932 Supervision of pregnancy with insufficient antenatal care, second trimester: Secondary | ICD-10-CM

## 2023-04-19 DIAGNOSIS — O99012 Anemia complicating pregnancy, second trimester: Secondary | ICD-10-CM

## 2023-04-19 LAB — POCT WET PREP (WET MOUNT)
Clue Cells Wet Prep Whiff POC: NEGATIVE
Trichomonas Wet Prep HPF POC: ABSENT

## 2023-04-19 LAB — POCT 1 HR PRENATAL GLUCOSE: Glucose 1 Hr Prenatal, POC: 150 mg/dL

## 2023-04-19 MED ORDER — MICONAZOLE NITRATE 2 % VA CREA: 1.0000 | TOPICAL_CREAM | Freq: Every day | VAGINAL | 0 refills | Status: AC

## 2023-04-19 MED ORDER — FERROUS SULFATE 325 (65 FE) MG PO TBEC: 325.0000 mg | DELAYED_RELEASE_TABLET | ORAL | 3 refills | Status: AC

## 2023-04-19 NOTE — Patient Instructions (Addendum)
Fue maravilloso verte hoy.  Por favor traiga TODOS sus medicamentos a cada visita.  Hoy hablamos de:  Hicimos pruebas de rutina para detectar infecciones de transmisin sexual. Le informar sobre Recruitment consultant.  Hemos programado tu Ultrasonido de Anatoma para el da 27 a las 13:00 horas.  Contine con sus vitaminas prenatales.  Le he enviado suplementos de hierro para su anemia, tmelos [employer].  Desafortunadamente, los [employer] venas son comunes durante el Psychiatrist y Psychologist, educational a medida que avanza el embarazo debido al crecimiento de su beb. Los calcetines de compresin pueden ayudar.  Si experimenta sangrado vaginal, prdida de lquidos, no siente que su beb se mueva tanto o comienza a Technical brewer con menos de 5 minutos de diferencia, vaya directamente a la Unidad de Evaluacin Materna en [facility] para una evaluacin.  Servicios de atencin de mujeres y maternidad ubicados en el lado sur de [place]. [facility] ([facility] en [address] [place], [place] [address]     Gracias por venir a su visita segn lo programado. ltimamente hemos tenido un gran problema de "ausencias" y esto limita significativamente nuestra capacidad para atender y Location manager a los pacientes. Como recordatorio amistoso: si no puede asistir a su cita, llame para cancelarla. Tenemos una poltica de no presentacin para aquellos que no cancelan dentro de las 24 horas. Nuestra poltica es que si falta o no cancela una cita dentro de las 24 horas, 3 veces en un perodo de 6 meses, es posible que lo despidan de [place].  Karl Pock por elegir Medicina familiar de [place].  Llame al [phone] si tiene alguna pregunta sobre la cita de [place].  Asegrese de programar un seguimiento en la recepcin antes de irse hoy.  Sabino Dick, D.O. PGY-3 Medicina Familiar

## 2023-04-19 NOTE — Progress Notes (Signed)
Patient Name: Jillian Turner Date of Birth: 03-03-1986 [facility] Initial Prenatal Visit  Jillian Turner is a 37 y.o. year old G3P2002 at [redacted]w[redacted]d who presents for her initial prenatal visit. Pregnancy is planned She reports  right lateral leg pain- spider veins. Started about 2 months ago . She is taking a prenatal vitamin.  She denies pelvic pain or vaginal bleeding.   Pregnancy Dating: The patient is dated by sure LMP.  LMP: 11/02/2022 Period is certain:  Yes.  Periods were regular:  Yes.  LMP was a typical period:  Yes.  Using hormonal contraception in 3 months prior to conception: No  Lab Review: Blood type: O Rh Status: + Antibody screen: Negative HIV: Negative RPR: Negative Hemoglobin electrophoresis reviewed: Yes Results of OB urine culture are: Negative Rubella: Immune Hep C Ab: Negative Varicella status is Immune  PMH: Reviewed and as detailed below: HTN: No  Gestational Hypertension/preeclampsia: No  Type 1 or 2 Diabetes: No  Depression:  No  Seizure disorder:  No VTE: No ,  History of STI No,  Abnormal Pap smear:  No, states last Pap was at HD in June 2023 and was normal Genital herpes simplex:  No   PSH: Gynecologic Surgery:  no Surgical history reviewed, notable for: None  Obstetric History: Obstetric history tab updated and reviewed.  Summary of prior pregnancies:  [id number]:  First delivery: 01/31/2010, at 40 weeks, vaginal. Female.  Second delivery 09/24/2015 at [redacted]w[redacted]d, vaginal. Female. Complicated by oligohydramnios intrapartum and PPH.  Cesarean delivery: No  Gestational Diabetes:  No Hypertension in pregnancy: No History of preterm birth: No History of LGA/SGA infant:  No History of shoulder dystocia: No Indications for referral were reviewed, and the patient has no obstetric indications for referral to High Risk OB Clinic at this time.   Social History: Partner's name: Jillian Turner   Tobacco use: No Alcohol use:   No Other substance use:  No  Current Medications:  Prenatal vitamins   Reviewed and appropriate in pregnancy.   Genetic and Infection Screen: Flow Sheet Updated Yes  Prenatal Exam: Gen: Well nourished, well developed.  No distress.  Vitals noted. HEENT: Normocephalic, atraumatic.  Neck supple without cervical lymphadenopathy, thyromegaly or thyroid nodules.  Fair dentition. CV: RRR no murmur, gallops or rubs Lungs: CTA B.  Normal respiratory effort without wheezes or rales. Abd: soft, NTND. +BS.  Uterus above pelvis. GU: Normal external female genitalia without lesions.  Nl vaginal, well rugated without lesions. White scant vaginal discharge.  Bimanual exam: No adnexal mass or TTP. No CMT.   Ext: No clubbing, cyanosis or edema. Does have venous insufficiency and talengectasias to right lower extremity Psych: Normal grooming and dress.  Not depressed or anxious appearing.  Normal thought content and process without flight of ideas or looseness of associations  GU exam chaperoned by Jillian Turner, CMA  Fetal heart tones: Appropriate     Assessment/Plan:  Jillian Turner is a 37 y.o. G3P2002 at [redacted]w[redacted]d who presents to initiate prenatal care. She is doing well.  Current pregnancy issues include telangiectasias.  Routine prenatal care: As dating is reliable, a dating ultrasound has not been ordered. Dating tab updated. Pre-pregnancy weight updated. Expected weight gain this pregnancy is 15-25 pounds Prenatal labs reviewed, notable for O+, antibody negative. Mild anemia at 10.9. Indications for referral to HROB were reviewed and the patient does not meet criteria for referral.  Medication list reviewed and updated.  Recommended patient see a dentist for  regular care.  Bleeding and pain precautions reviewed. Importance of prenatal vitamins reviewed.  Genetic screening offered. Patient opted for: no screening. The patient has the following indications for aspirinto begin 81 mg at  12-16 weeks: One high risk condition: no single high risk condition  MORE than one moderate risk condition: low SES   Aspirin was not recommended today based upon above risk factors (one high risk condition or more than one moderate risk factor)  The patient will be age 37 or over at time of delivery. Referral to genetic counseling was offered today but declines.  The patient has the following risk factors for preexisting diabetes: BMI > 25 and high risk ethnicity (Latino, [place] American, Native American, [place] Islander, Asian Naval architect) . An early 1 hour glucose tolerance test was ordered. Pregnancy Medical Home and PHQ-9 forms completed, problems noted: Yes  2. Pregnancy issues include the following which were addressed today:  Anemia: recommend iron supplements every other day.  Telangiectasias: reassured that unfortunately normal in pregnancy. Can wear compression socks  BMI 25. Early 1-hr Glucola today was elevated at 150. Returning on 4/25 for 3-hr GTT.  Anatomy U/S scheduled for 4/27 at 1PM  Yeast infection: Miconazole 2% QHS x 7 days   Follow up in 2.5 weeks in faculty OB clinic. Per patient preference [place]/u in 4 weeks.

## 2023-04-20 LAB — CERVICOVAGINAL ANCILLARY ONLY
Chlamydia: NEGATIVE
Comment: NEGATIVE
Comment: NORMAL
Neisseria Gonorrhea: NEGATIVE

## 2023-04-22 ENCOUNTER — Other Ambulatory Visit: Payer: Self-pay

## 2023-04-26 ENCOUNTER — Other Ambulatory Visit (INDEPENDENT_AMBULATORY_CARE_PROVIDER_SITE_OTHER): Payer: Self-pay

## 2023-04-26 DIAGNOSIS — O0932 Supervision of pregnancy with insufficient antenatal care, second trimester: Secondary | ICD-10-CM

## 2023-04-26 DIAGNOSIS — Z3482 Encounter for supervision of other normal pregnancy, second trimester: Secondary | ICD-10-CM

## 2023-04-26 LAB — POCT CBG (FASTING - GLUCOSE)-MANUAL ENTRY: Glucose Fasting, POC: 97 mg/dL (ref 70–99)

## 2023-04-27 LAB — GESTATIONAL GLUCOSE TOLERANCE
Glucose, Fasting: 84 mg/dL (ref 70–94)
Glucose, GTT - 1 Hour: 129 mg/dL (ref 70–179)
Glucose, GTT - 2 Hour: 121 mg/dL (ref 70–154)
Glucose, GTT - 3 Hour: 53 mg/dL — ABNORMAL LOW (ref 70–139)

## 2023-05-06 ENCOUNTER — Other Ambulatory Visit: Payer: Self-pay

## 2023-05-06 ENCOUNTER — Ambulatory Visit (INDEPENDENT_AMBULATORY_CARE_PROVIDER_SITE_OTHER): Payer: Self-pay | Admitting: Family Medicine

## 2023-05-06 VITALS — BP 117/74 | HR 98 | Wt 141.6 lb

## 2023-05-06 DIAGNOSIS — Z3482 Encounter for supervision of other normal pregnancy, second trimester: Secondary | ICD-10-CM

## 2023-05-06 NOTE — Patient Instructions (Addendum)
Go to the MAU at [facility] at [facility] if: You have cramping/contractions that do not go away with drinking water Your water breaks.  Sometimes it is a big gush of fluid, sometimes it is just a trickle that keeps getting your underwear wet or running down your legs You have vaginal bleeding.    You do not feel your baby moving like normal.  If you do not, get something to eat and drink and lay down and focus on feeling your baby move. If your baby is still not moving like normal, you should go to MAU.   Segundo trimestre de [employer] Second Trimester of Pregnancy  El segundo trimestre de Psychiatrist va desde la semana 13 hasta la semana 27. Es Designer, jewellery desde el mes 4 hasta el mes 6 de [place]. El segundo trimestre suele ser el momento en el que mejor se siente. Su organismo se ha adaptado a Charity fundraiser, y comienza a Diplomatic Services operational officer. Durante el segundo trimestre: Las nuseas del embarazo han disminuido o han desaparecido completamente. Usted puede tener ms energa. Es posible que tenga un aumento del apetito. El segundo trimestre es tambin un perodo en el que el beb en gestacin (feto) crece rpidamente. Hacia el final del sexto mes, el feto puede medir aproximadamente 12 pulgadas y pesar alrededor de 1 libras. Es probable que sienta que el beb se mueve (da pataditas) entre las 16 y 20 semanas del Psychiatrist. Cambios en el cuerpo durante el segundo trimestre Su cuerpo continua experimentando numerosos cambios durante su segundo trimestre. Los cambios varan y generalmente vuelven a la normalidad despus del nacimiento del beb. Cambios fsicos Seguir American Standard Companies. Notar que la parte baja del abdomen sobresale. Podrn aparecer las primeras Albertson's caderas, el abdomen y las [place]. Las ConAgra Foods seguirn creciendo y se tornarn sensibles. Pueden aparecer zonas oscuras o manchas (cloasma o mscara del embarazo) en el rostro. Es posible que se forme una  lnea oscura desde el ombligo hasta la zona del pubis (linea nigra). Tal vez haya cambios en el cabello. Esto cambios pueden incluir su engrosamiento, crecimiento rpido y Allied Waste Industries textura. A algunas personas tambin se les cae el cabello durante o despus del [place], o tienen el cabello seco o fino. Cambios en la salud Comienza a tener dolores de [place]. Es posible que tenga acidez estomacal. Puede tener estreimiento. Pueden aparecer hemorroides o abultarse e hincharse las venas (venas varicosas). Las Veterinary surgeon y estar sensibles al cepillado y al hilo dental. Nicanor Bake vez tenga necesidad de orinar con ms frecuencia porque el feto est ejerciendo presin sobre la vejiga. Puede sentir dolor en la espalda. Esto se debe a: Aumento de peso. Las hormonas del Management consultant las articulaciones en la pelvis. Un cambio en el peso y los msculos que ayudan a Pharmacologist su equilibrio. Siga estas instrucciones en su casa: Medicamentos Siga las instrucciones del mdico en relacin con el uso de medicamentos. Durante el embarazo, hay medicamentos que pueden tomarse y otros que no. No tome medicamentos a menos que lo haya autorizado el mdico. Tome vitaminas prenatales que contengan por lo menos 600 microgramos (mcg) de cido flico. Comida y bebida Lleve una dieta saludable que incluya frutas y verduras frescas, cereales integrales, buenas fuentes de protenas como carnes [place], huevos o tofu, y productos lcteos descremados. Evite la carne cruda y el [place], la [place] y el queso sin Market researcher. Estos portan grmenes que pueden provocar dao tanto a usted como al  beb. Es posible que tenga que tomar estas medidas para prevenir o tratar el estreimiento: Product manager suficiente lquido como para Pharmacologist la orina de color amarillo plido. Consumir alimentos ricos en fibra, como frijoles, cereales integrales, y frutas y verduras frescas. Limitar el consumo de alimentos ricos en grasa y azcares  procesados, como los alimentos fritos o dulces. Actividad Haga ejercicio solamente como se lo haya indicado el mdico. La mayora de las personas pueden continuar su rutina de ejercicios durante el [place]. Intente realizar como mnimo 30 minutos de actividad fsica por lo menos 5 das a la [place]. Deje de hacer ejercicio si comienza a Teacher, music. Deje de hacer ejercicio si le aparecen dolor o clicos en la parte baja del vientre o de la espalda. Evite hacer ejercicio si hace mucho calor o humedad, o si se encuentra a una altitud elevada. Evite levantar pesos Fortune Brands. Si lo desea, puede seguir teniendo [employer] [place] Travelers, salvo que el mdico le indique lo contrario. Alivio del dolor y del Dentist Use un sujetador que le brinde buen soporte para prevenir las molestias causadas por la sensibilidad en las [place]. Dese baos de asiento con agua tibia para Engineer, materials o las molestias causadas por las hemorroides. Use una crema para las hemorroides si el mdico la autoriza. Descanse con las piernas levantadas (elevadas) si tiene calambres en las piernas o dolor en la parte baja de la espalda. Si tiene venas varicosas: Use medias de compresin como se lo haya indicado el mdico. Eleve los pies durante 15 minutos, 3 o 4 veces por da. Limite el consumo de sal en su dieta. Seguridad Use el cinturn de seguridad en todo momento mientras conduce o va en auto. Hable con el mdico si es vctima de Gen[employer]ine Parts o fsico. Estilo de vida No se d baos de inmersin en agua caliente, baos turcos ni saunas. No se haga duchas vaginales. No use tampones ni toallas higinicas perfumadas. Evite el contacto con las bandejas sanitarias de los gatos y la tierra que estos animales usan. Estos elementos contienen bacterias que pueden causar defectos congnitos al beb y la posible prdida del feto debido a un aborto espontneo o muerte fetal. No use remedios a base de hierbas, alcohol,  drogas ilegales ni medicamentos que no estn aprobados por el mdico. Las sustancias qumicas de estos productos pueden daar al beb. No consuma ningn producto que contenga nicotina o tabaco, como cigarrillos, cigarrillos electrnicos y tabaco de Theatre manager. Si necesita ayuda para dejar de fumar, consulte al mdico. Instrucciones generales Durante una visita prenatal de rutina, el Office Depot har un examen fsico y Probation officer. Tambin le hablar sobre su salud general. Cumpla con todas las visitas de seguimiento. Esto es importante. Pdale al mdico que la derive a clases de educacin prenatal en su localidad. Pida ayuda si tiene necesidades nutricionales o de asesoramiento Academic librarian. El mdico puede aconsejarla o derivarla a especialistas para que la ayuden con diferentes necesidades. Dnde buscar ms informacin American Pregnancy Association (Asociacin Americana del Embarazo): americanpregnancy.org Celanese Corporation of Obstetricians and Gynecologists (Colegio Estadounidense de Obstetras y [place]): EmploymentAssurance.cz? Office on Pitney Bowes (Oficina para la Salud de la Mujer): MightyReward.co.nz Comunquese con un mdico si tiene: Un dolor de cabeza que no desaparece despus de Science writer. Cambios en la visin o ve manchas delante de los ojos. Clicos leves, presin en la pelvis o dolor persistente en el abdomen. Nuseas persistentes, vmitos o diarrea. Secrecin vaginal con mal olor u [place] con  olor ftido. Dolor al Beatrix Shipper. Hinchazn sbita o extrema del rostro, las [address], los tobillos, los pies o las piernas. Grant Ruts. Busque ayuda de inmediato si: Tiene una prdida de lquido por la vagina. Tiene sangrado ligero o manchas vaginales. Tiene dolor intenso o clicos en el abdomen. Presenta dificultad para respirar. Siente dolor en el pecho. Tiene episodios de Baxter International. No ha sentido a su beb moverse durante el perodo de Sempra Energy indic el  mdico. Tiene dolor, hinchazn o enrojecimiento nuevos o ms intensos en un brazo o una pierna. Resumen El segundo trimestre de embarazo va desde la semana 13 hasta la 27 (desde el mes 4 hasta el 6). No use remedios a base de hierbas, alcohol, drogas ilegales ni medicamentos que no estn aprobados por el mdico. Las sustancias qumicas de estos productos pueden daar al beb. Haga ejercicio solamente como se lo haya indicado el mdico. La mayora de las personas pueden continuar su rutina de ejercicios durante el [place]. Cumpla con todas las visitas de seguimiento. Esto es importante. Esta informacin no tiene Theme park manager el consejo del mdico. Asegrese de hacerle al mdico cualquier pregunta que tenga. Document Revised: 06/24/2020 Document Reviewed: 06/24/2020 Elsevier Patient Education  2023 ArvinMeritor.

## 2023-05-08 NOTE — Progress Notes (Signed)
[facility] Visit  Jillian Turner is a 37 y.o. G3P2002 at [redacted]w[redacted]d (via LMP) who presents to [facility] for routine follow up. Seen today along with resident Dr. Hyacinth Meeker. Prenatal course, history, notes, ultrasounds, and laboratory results reviewed.  Denies cramping/ctx, fluid leaking, vaginal bleeding, or decreased fetal movement. Taking PNV.    Primary Prenatal Care Provider: Dr. Melba Coon  Postpartum Plans: - delivery planning: SVD with epidural - circumcision: no - feeding: breast and formula - pediatrician[facility] - contraception: desires BTL, needs cost info  FHR: 145bpm Uterine size: 25cm  Assessment & Plan  1. Routine prenatal care: - requested results for anatomy ultrasound from 4/27, waiting on result - plan for routine 1hr GTT at next OB visit in 2 weeks (just had "early" 3hr GTT 10d ago) - declined COVID vaccine today - scheduled all future visits through end of June  2. Desire for BTL - will need to provide patient with pricing information at next visit  3. History of postpartum hemorrhage (in setting of IOL for oligo, manual extraction of placenta) - check CBC at next visit as part of 28w labs, low threshold to increase iron supplementation if needed  Next prenatal visit in 2 weeks. At that visit needs 1hr GTT, CBC, HIV, RPR, and Tdap rx. Labor & fetal movement precautions discussed.  Levert Feinstein, MD [facility]

## 2023-05-14 ENCOUNTER — Telehealth: Payer: Self-pay | Admitting: Family Medicine

## 2023-05-14 NOTE — Telephone Encounter (Signed)
Attempted to reach patient with Spanish interpreter to let her know about her anatomy ultrasound results from 4/27.  No answer, left HIPAA compliant voicemail via interpreter asking patient to call back.  Ultrasound looks good.  All anatomy seen and is normal.  Photo of report in media tab.  EFW 61st percentile.  Dating concordant with LMP.  If patient returns the call please tell her that her ultrasound looks good and Dr. Melba Coon can review it in more detail with her at her next appointment.  I am happy to speak with her if she has questions.  Latrelle Dodrill, MD

## 2023-05-25 NOTE — Progress Notes (Unsigned)
  [facility] Prenatal Visit  Jillian Turner is a 37 y.o. G3P2002 at [redacted]w[redacted]d here for routine follow up. She is dated by {Ob dating:14516}.  She reports {symptoms; pregnancy related:14538}. She reports fetal movement. She denies vaginal bleeding, contractions, or loss of fluid. See flow sheet for details.  There were no vitals filed for this visit. ***   A/P: Pregnancy at [redacted]w[redacted]d.  Doing well.   Routine prenatal care:  Dating reviewed, dating tab is {correct:23336::"correct"} Fetal heart tones {appropriate:23337} Fundal height {fundal height:23342::"within expected range. "} Infant feeding choice: {Breasfeeding:23347::"Breastfeeding"} Contraception choice: {postpartum contraception:23348} Infant circumcision desired {Response; yes/no/na:63}  The patient {CWHCS:23409::"does not have a history of Cesarean delivery and no referral to [facility] is indicated"} Influenza vaccine {given:23340}  Tdap {was/was not:19854::"was"} given today. 1 hour glucola, CBC, RPR, and HIV {WERE / WERE NOT:19253::"were"} obtained today.    Rh status was reviewed and patient {does/does not:19886} need Rhogam.  Rhogam {WAS/WAS NOT:[phone]::"was not"} given today.  Pregnancy medical home and PHQ-9 forms {WERE / WERE NOT:19253} done today and reviewed.   Childbirth and education classes {WERE / WERE ZOX:09604} offered. Pregnancy education regarding benefits of breastfeeding, contraception, fetal growth, expected weight gain, and safe infant sleep were discussed.  Preterm labor and fetal movement precautions reviewed.  2. Pregnancy issues include the following and were addressed as appropriate today:    Problem list and pregnancy box updated: {yes/no:20286::"Yes"}.   Patient scheduled in [facility] during third trimester on ***.  Follow up 2 weeks.

## 2023-05-27 ENCOUNTER — Ambulatory Visit (INDEPENDENT_AMBULATORY_CARE_PROVIDER_SITE_OTHER): Payer: Self-pay | Admitting: Family Medicine

## 2023-05-27 ENCOUNTER — Other Ambulatory Visit: Payer: Self-pay

## 2023-05-27 VITALS — BP 128/74 | HR 99 | Wt 142.2 lb

## 2023-05-27 DIAGNOSIS — Z758 Other problems related to medical facilities and other health care: Secondary | ICD-10-CM | POA: Insufficient documentation

## 2023-05-27 DIAGNOSIS — Z3483 Encounter for supervision of other normal pregnancy, third trimester: Secondary | ICD-10-CM

## 2023-05-27 DIAGNOSIS — Z603 Acculturation difficulty: Secondary | ICD-10-CM

## 2023-05-27 LAB — POCT 1 HR PRENATAL GLUCOSE: Glucose 1 Hr Prenatal, POC: 150 mg/dL

## 2023-05-27 NOTE — Addendum Note (Signed)
Addended by: Jennette Bill on: 05/27/2023 10:32 AM   Modules accepted: Orders

## 2023-05-27 NOTE — Patient Instructions (Addendum)
Fue maravilloso verte hoy.  Por favor traiga TODOS sus medicamentos a cada visita.  Hoy hablamos de:  El costo de [place] de trompas es de [phone] (no se puede aplicar ningn descuento por pago propio).  Esto DEBE pagarse antes de la entrega. Nuestra oficina puede cobrar este pago si se paga en su totalidad. Si desea pagar poco a poco, le recomendamos que [employer] pagos a otra oficina de obstetricia.  Si experimenta sangrado vaginal, prdida de lquidos, no siente que su beb se mueva tanto o comienza a Technical brewer con menos de 5 minutos de diferencia, vaya directamente a la Unidad de Evaluacin Materna en [facility] para una evaluacin.  Servicios de atencin de mujeres y maternidad ubicados en el lado sur de [place]. [facility] (Entrada C en [address] [place], [place] [address]     Gracias por venir a su visita segn lo programado. ltimamente hemos tenido un gran problema de "ausencias" y esto limita significativamente nuestra capacidad para atender y Location manager a los pacientes. Como recordatorio amistoso: si no puede asistir a su cita, llame para cancelarla. Tenemos una poltica de no presentacin para aquellos que no cancelan dentro de las 24 horas. Nuestra poltica es que si falta o no cancela una cita dentro de las 24 horas, 3 veces en un perodo de 6 meses, es posible que lo despidan de [place].  Karl Pock por elegir Medicina familiar de [place].  Llame al [phone] si tiene alguna pregunta sobre la cita de [place].  Asegrese de programar un seguimiento en la recepcin antes de irse hoy.  Sabino Dick, D.O. PGY-3 Medicina Familiar

## 2023-05-28 LAB — CBC
Hematocrit: 32.7 % — ABNORMAL LOW (ref 34.0–46.6)
Hemoglobin: 10.8 g/dL — ABNORMAL LOW (ref 11.1–15.9)
MCH: 28 pg (ref 26.6–33.0)
MCHC: 33 g/dL (ref 31.5–35.7)
MCV: 85 fL (ref 79–97)
Platelets: 396 10*3/uL (ref 150–450)
RBC: 3.86 x10E6/uL (ref 3.77–5.28)
RDW: 12.4 % (ref 11.7–15.4)
WBC: 9.1 10*3/uL (ref 3.4–10.8)

## 2023-05-31 ENCOUNTER — Other Ambulatory Visit (INDEPENDENT_AMBULATORY_CARE_PROVIDER_SITE_OTHER): Payer: Self-pay

## 2023-05-31 DIAGNOSIS — Z3483 Encounter for supervision of other normal pregnancy, third trimester: Secondary | ICD-10-CM

## 2023-05-31 LAB — POCT CBG (FASTING - GLUCOSE)-MANUAL ENTRY: Glucose Fasting, POC: 90 mg/dL (ref 70–99)

## 2023-06-01 LAB — GESTATIONAL GLUCOSE TOLERANCE
Glucose, Fasting: 80 mg/dL (ref 70–94)
Glucose, GTT - 1 Hour: 143 mg/dL (ref 70–179)
Glucose, GTT - 2 Hour: 136 mg/dL (ref 70–154)
Glucose, GTT - 3 Hour: 113 mg/dL (ref 70–139)

## 2023-06-07 ENCOUNTER — Ambulatory Visit (INDEPENDENT_AMBULATORY_CARE_PROVIDER_SITE_OTHER): Payer: Self-pay | Admitting: Family Medicine

## 2023-06-07 ENCOUNTER — Encounter: Payer: Self-pay | Admitting: Family Medicine

## 2023-06-07 ENCOUNTER — Ambulatory Visit (HOSPITAL_COMMUNITY)
Admission: RE | Admit: 2023-06-07 | Discharge: 2023-06-07 | Disposition: A | Discharge status: Home / Self Care | Payer: Self-pay | Source: Ambulatory Visit | Attending: Family Medicine | Admitting: Family Medicine

## 2023-06-07 VITALS — BP 123/91 | HR 112 | Wt 142.5 lb

## 2023-06-07 DIAGNOSIS — R000 Tachycardia, unspecified: Secondary | ICD-10-CM | POA: Insufficient documentation

## 2023-06-07 DIAGNOSIS — Z3483 Encounter for supervision of other normal pregnancy, third trimester: Secondary | ICD-10-CM

## 2023-06-07 NOTE — Patient Instructions (Signed)
Programe una visita de seguimiento en 2 semanas.  Prenatal Classes Go to [email or web address]  Precauciones de devolucin relacionadas con el embarazo  Los siguientes son signos/sntomas que son anormales en el embarazo y pueden requerir una evaluacin adicional por parte de un mdico: Vaya a MAU en [facility] en [place] si: Tiene calambres/contracciones que no desaparecen con agua potable, especialmente si duran de 30 segundos a 1,5 minutos, aparecen y desaparecen cada 5-10 minutos durante una hora o ms, o si son cada vez ms fuertes y no puede caminar o hablar mientras tener una contraccin/calambre. Tu agua se rompe. A veces es un gran chorro de lquido, a veces es solo un goteo que sigue mojando tu ropa interior o corriendo por tus piernas. Tiene sangrado vaginal. No siente que su beb se mueva normalmente. Si no lo hace, busque algo para comer y beber (algo fro o algo con azcar como mantequilla de man o jugo) y acustese y concntrese en sentir cmo se mueve su beb. Si su beb todava no se mueve con normalidad, debe ir a MAU. Debe sentir que su beb se mueve 6 veces en una hora o 10 veces en dos horas. Tiene un dolor de cabeza persistente que no desaparece con 1 g de Tylenol, cambios en la visin, dolor en el pecho, dificultad para respirar, dolor intenso en la parte superior derecha del abdomen, empeoramiento de la hinchazn de las piernas; todos estos pueden ser signos de presin arterial alta en el embarazo y necesita para ser evaluado por un proveedor inmediatamente  Todos estos son preocupantes durante el embarazo y, si tiene alguno de estos, le recomiendo que llame a su PCP y se presente a la Unidad de Admisiones de Maternidad (mapa a continuacin) para una evaluacin adicional.  Para cualquier emergencia relacionada con el embarazo, dirjase a la Unidad de Admisiones de Maternidad en el [facility] en el [facility]. Usar la Entrada C del hospital.    

## 2023-06-07 NOTE — Assessment & Plan Note (Addendum)
Most likely physiologic tachycardia of pregnancy exacerbated by mild anemia; however, this is new change for patient. No signs of DVT or VTE on exam. EKG with regular rhythm.  - MAU precautions given for palpitations, shortness of breath, dizziness  - Continue ferrous sulfate supplement  - Counseling given to stay hydrated.  - If tachycardia persists will obtain TSH, CBC at next appointment in 2 weeks.

## 2023-06-07 NOTE — Progress Notes (Signed)
Repeat BP- 123/91  Repeat HR- 121

## 2023-06-07 NOTE — Progress Notes (Addendum)
  [facility] Prenatal Visit  Jillian Turner is a 37 y.o. G3P2002 at [redacted]w[redacted]d here for routine follow up. She is dated by LMP.  She reports no complaints.  She reports fetal movement. She denies vaginal bleeding, contractions, or loss of fluid.  See flow sheet for details.  Vitals:   06/07/23 0856 06/07/23 0905  BP: (!) 123/91   Pulse: (!) 121 (!) 112  General: well appearing, in no acute distress CV: Mildly tachycardic to 112, regular rhythm, radial pulses equal and palpable, cap refill < 2seconds, no BLE edema  Resp: Normal work of breathing on room air, CTAB Abd: Gravid, Soft, non tender, non distended  Neuro: Alert & Oriented x 4   A/P: Pregnancy at [redacted]w[redacted]d.  Doing well.   Routine prenatal care:  Dating reviewed, dating tab is correct Fetal heart tones: Appropriate Fundal height: within expected range.  The patient does not have a history of HSV and valacyclovir is not indicated at this time.  The patient does not have a history of Cesarean delivery and no referral to [facility] is indicated Infant feeding choice: Both  Contraception choice: Tubal ligation, forms to be signed at 32 weeks  Infant circumcision desired no Influenza vaccine not administered as not influenza season.   Letter for Tdap given at last appointment. Patient received vaccine at the HD.  Childbirth and education classes were offered,  but patient declined.  Pregnancy education regarding benefits of breastfeeding, contraception, fetal growth, expected weight gain, and safe infant sleep were discussed.  Preterm labor and fetal movement precautions reviewed.   2. Pregnancy issues include the following and were addressed as appropriate today:  Tachycardia - Patient had mild tachycardia today without symptoms. She has been consistently 80s-90s HR in the past, but today 110s-120s. No evidence of DVT, arrhythmia on exam. Could be normal tachycardia of pregnancy with anemia. Patient has  been taking her iron pill. EKG performed in clinic with sinus tachycardia to 118. Patient was told to go to the MAU in the setting of palpitations, dizziness, shortness of breath. If tachycardia persists, will obtain TSH, CBC at next appointment.   Desire for BTL - Patient desires BTL. She has been given the financial information. Will be consented at next appointment.   Problem list and pregnancy box updated: Yes.   Follow up 2 weeks.

## 2023-06-21 ENCOUNTER — Encounter: Payer: Self-pay | Admitting: Student

## 2023-06-21 ENCOUNTER — Ambulatory Visit (INDEPENDENT_AMBULATORY_CARE_PROVIDER_SITE_OTHER): Payer: Self-pay | Admitting: Student

## 2023-06-21 VITALS — BP 124/75 | HR 99 | Wt 146.2 lb

## 2023-06-21 DIAGNOSIS — R000 Tachycardia, unspecified: Secondary | ICD-10-CM

## 2023-06-21 DIAGNOSIS — Z3483 Encounter for supervision of other normal pregnancy, third trimester: Secondary | ICD-10-CM

## 2023-06-21 NOTE — Progress Notes (Signed)
  [facility] Prenatal Visit  Jillian Turner is a 37 y.o. G3P2002 at [redacted]w[redacted]d here for routine follow up. She is dated by LMP.  She reports no complaints.  She reports fetal movement. She denies vaginal bleeding, contractions, or loss of fluid.  See flow sheet for details.  Vitals:   06/21/23 0852  BP: 124/75  Pulse: 99     A/P: Pregnancy at [redacted]w[redacted]d.  Doing well.  No concerns or complaints today. Routine prenatal care:  Dating reviewed, dating tab is correct Fetal heart tones: Appropriate-130s bpm Fundal height: within expected range.  The patient does not have a history of HSV and valacyclovir is not indicated at this time.  The patient does not have a history of Cesarean delivery and no referral to [facility] is indicated Infant feeding choice: Both  Contraception choice: Tubal ligation, forms to be signed at 32 weeks -need to get form signed at next visit, patient was still thinking about it. Infant circumcision desired no Pregnancy education regarding benefits of breastfeeding, contraception, fetal growth, expected weight gain, and safe infant sleep were discussed.  Preterm labor and fetal movement precautions reviewed.   2. Pregnancy issues include the following and were addressed as appropriate today:  Tachycardia-patient persistently tachycardic at 99 bpm, noted in chart review in the past she has been in the 110s-120s throughout pregnancy.  She is in normal sinus rhythm today.  Asymptomatic-denies palpitations, dizziness, shortness of breath, chest pain.  Discussed MAU precautions.  Discussed appropriate fluid hydration.  Will obtain TSH and CBC today. Tachycardia could be secondary to fluid shift/hormonal changes, dehydration, thyroid abnormality, anemia.  Problem list and pregnancy box updated: Yes.   Follow up 2 weeks.  Appointment scheduled on 07/06/2023.

## 2023-06-21 NOTE — Patient Instructions (Signed)
Programe una visita de seguimiento en 2 semanas.  Prenatal Classes Go to [email or web address]  Precauciones de devolucin relacionadas con el embarazo  Los siguientes son signos/sntomas que son anormales en el embarazo y pueden requerir una evaluacin adicional por parte de un mdico: Vaya a MAU en [facility] en [place] si: Tiene calambres/contracciones que no desaparecen con agua potable, especialmente si duran de 30 segundos a 1,5 minutos, aparecen y desaparecen cada 5-10 minutos durante una hora o ms, o si son cada vez ms fuertes y no puede caminar o hablar mientras tener una contraccin/calambre. Tu agua se rompe. A veces es un gran chorro de lquido, a veces es solo un goteo que sigue mojando tu ropa interior o corriendo por tus piernas. Tiene sangrado vaginal. No siente que su beb se mueva normalmente. Si no lo hace, busque algo para comer y beber (algo fro o algo con azcar como mantequilla de man o jugo) y acustese y concntrese en sentir cmo se mueve su beb. Si su beb todava no se mueve con normalidad, debe ir a MAU. Debe sentir que su beb se mueve 6 veces en una hora o 10 veces en dos horas. Tiene un dolor de cabeza persistente que no desaparece con 1 g de Tylenol, cambios en la visin, dolor en el pecho, dificultad para respirar, dolor intenso en la parte superior derecha del abdomen, empeoramiento de la hinchazn de las piernas; todos estos pueden ser signos de presin arterial alta en el embarazo y necesita para ser evaluado por un proveedor inmediatamente  Todos estos son preocupantes durante el embarazo y, si tiene alguno de estos, le recomiendo que llame a su PCP y se presente a la Unidad de Admisiones de Maternidad (mapa a continuacin) para una evaluacin adicional.  Para cualquier emergencia relacionada con el embarazo, dirjase a la Unidad de Admisiones de Maternidad en el [facility] en el [facility]  [place]. Usar la Entrada C del hospital.    

## 2023-06-22 LAB — TSH: TSH: 1.31 u[IU]/mL (ref 0.450–4.500)

## 2023-07-05 NOTE — Progress Notes (Unsigned)
  [facility] Prenatal Visit  Jillian Turner is a 37 y.o. G3P2002 at [redacted]w[redacted]d for routine follow up.  She reports***. She reports fetal movement. She denies vaginal bleeding, contractions, or loss of fluid.   See flow sheet for details.  There were no vitals filed for this visit.   A/P: Pregnancy at [redacted]w[redacted]d.  Doing well.   Routine prenatal care:  Infant feeding choice*** Contraception choice*** Infant circumcision desired {Response; yes/no/na:63} Tdap{was/was not:19854::"was"} given today. COVID vaccination was discussed and ***.  GBS/GC/CZ testing {was/was not:19854::"was"} performed today. Preterm labor precautions reviewed. Safe sleep discussed. Kick counts reviewed. Preterm labor precautions reviewed. Safe sleep discussed. Kick counts reviewed.  2. Pregnancy issues include the following and were addressed as appropriate today:  ***  Problem list and pregnancy box updated: {yes/no:20286::"Yes"}.   Follow up in 2 weeks.

## 2023-07-06 ENCOUNTER — Other Ambulatory Visit: Payer: Self-pay

## 2023-07-06 ENCOUNTER — Ambulatory Visit (INDEPENDENT_AMBULATORY_CARE_PROVIDER_SITE_OTHER): Payer: Self-pay | Admitting: Student

## 2023-07-06 VITALS — BP 119/76 | HR 97 | Wt 146.0 lb

## 2023-07-06 DIAGNOSIS — Z3483 Encounter for supervision of other normal pregnancy, third trimester: Secondary | ICD-10-CM

## 2023-07-06 NOTE — Patient Instructions (Signed)
Please make a follow up appointment in 1 week.  Prenatal Classes Go to [email or web address] for more information on the pregnancy and child birth classes that [facility]  has to offer.   Pregnancy Related Return Precautions The follow are signs/symptoms that are abnormal in pregnancy and may require further evaluation by a physician: Go to the MAU at [facility] at [facility] if: You have cramping/contractions that do not go away with drinking water, especially if they are lasting 30 seconds to 1.5 minutes, coming and going every 5-10 minutes for an hour or more, or are getting stronger and you cannot walk or talk while having a contraction/cramp. Your water breaks.  Sometimes it is a big gush of fluid, sometimes it is just a trickle that keeps getting your underwear wet or running down your legs You have vaginal bleeding.    You do not feel your baby moving like normal.  If you do not, get something to eat and drink (something cold or something with sugar like peanut butter or juice) and lay down and focus on feeling your baby move. If your baby is still not moving like normal, you should go to MAU. You should feel your baby move 6 times in one hour, or 10 times in two hours. You have a persistent headache that does not go away with 1 g of Tylenol, vision changes, chest pain, difficulty breathing, severe pain in your right upper abdomen, worsening leg swelling- these can all be signs of high blood pressure in pregnancy and need to be evaluated by a provider immediately  These are all concerning in pregnancy and if you have any of these I recommend you call your PCP and present to the Maternity Admissions Unit (map below) for further evaluation.  For any pregnancy-related emergencies, please go to the Maternity Admissions Unit in the [facility] at [facility]. You will use hospital Entrance C.    Our clinic number is [phone].     Programe una visita de seguimiento en [employer].  Prenatal Classes Go to [email or web address]  Precauciones de devolucin relacionadas con Jillian Turner  Los siguientes son signos/sntomas que son anormales en el Psychiatrist y pueden requerir una evaluacin adicional por parte de un mdico: Vaya a MAU en [facility] en [place] si: Tiene calambres/contracciones que no desaparecen con agua potable, especialmente si duran de 30 segundos a 1,5 minutos, aparecen y desaparecen cada 5-10 minutos durante una hora o ms, o si son cada vez ms fuertes y no puede caminar o Physiological scientist. Tu agua se rompe. A veces es un gran chorro de lquido, a veces es solo un goteo que sigue mojando tu ropa interior o corriendo por tus piernas. Tiene sangrado vaginal. No siente que su beb se mueva normalmente. Si no lo hace, busque algo para comer y beber (algo fro o algo con azcar [name] de man o [name]) y acustese y concntrese en sentir cmo se mueve su beb. Si su beb todava no se mueve con normalidad, debe ir a MAU. Debe sentir que su beb se mueve 6 veces en una hora o 10 veces en dos horas. Tiene un dolor de cabeza persistente que no desaparece con 1 g de Tylenol, cambios en la visin, dolor en el pecho, dificultad para respirar, dolor intenso en la parte superior derecha del abdomen, empeoramiento de la hinchazn de las piernas; todos estos pueden ser signos de presin arterial  alta en el embarazo y necesita para ser evaluado por un proveedor inmediatamente  [place] son preocupantes durante el Psychiatrist y, si tiene alguno de [name], le recomiendo que llame a su PCP y se presente a Furniture conservator/restorer de Admisiones de Maternidad (mapa a continuacin) para [name].  Para cualquier emergencia relacionada con [name], dirjase a la [place] de Admisiones de Maternidad en [address] de [name] y [facility]. Usar la Entrada C del hospital.

## 2023-07-13 ENCOUNTER — Other Ambulatory Visit (HOSPITAL_COMMUNITY)
Admission: RE | Admit: 2023-07-13 | Discharge: 2023-07-13 | Disposition: A | Discharge status: Home / Self Care | Payer: Self-pay | Source: Ambulatory Visit | Attending: Family Medicine | Admitting: Family Medicine

## 2023-07-13 ENCOUNTER — Ambulatory Visit (INDEPENDENT_AMBULATORY_CARE_PROVIDER_SITE_OTHER): Payer: Self-pay | Admitting: Student

## 2023-07-13 VITALS — BP 129/82 | HR 100 | Wt 146.1 lb

## 2023-07-13 DIAGNOSIS — Z3483 Encounter for supervision of other normal pregnancy, third trimester: Secondary | ICD-10-CM | POA: Insufficient documentation

## 2023-07-13 DIAGNOSIS — Z113 Encounter for screening for infections with a predominantly sexual mode of transmission: Secondary | ICD-10-CM | POA: Insufficient documentation

## 2023-07-13 NOTE — Patient Instructions (Addendum)
Fue genial verte hoy! Karl Pock por elegir [facility] para su atencin obsttrica. Jillian Turner fue vista para visita obstetra.  Continuaremos vindote una vez por semana ahora!  Estaremos revisando hisopos de GBS y [place] de [place] y clamidia hoy o en las prximas semanas, llamaremos o enviaremos mensajes con [place].  Su plan de parto es muy importante para nosotros, por favor proporcinenos los [place] a medida que [place].  Tambin puedes empezar a contar patadas; deberas sentir de 3 a 4 patadas en 1 hora o 10 en 2 horas. Haga esto solo si le preocupa que el beb no se mueva.   Informacin del recin nacido  El beb necesita dormir en un moiss despus del nacimiento, con una sbana [place] y sin animales de peluche u otros elementos en el rea para Transport planner asfixia, trate de evitar que otras personas besen al beb e infrmeles sobre las preocupaciones sobre el sndrome del beb sacudido. Alimentarlo es lo mejor!! nunca deben pasar ms de 3 horas en el da o 4 horas en la noche sin alimentarse, y [employer] cuidados de los enfermos una vez que nazca el beb.  Llame a su clnica de obstetricia o vaya al [facility] si: ? Comienzas a tener contracciones fuertes y frecuentes. ? Se te rompe fuente.  A veces es un gran chorro de lquido, a veces es solo un chorrito que sigue mojando tus bragas o corriendo por tus piernas. ? Tienes sangrado vaginal.  Es normal tener un poco de sangrado si le revisaron el cuello uterino.  ? No siente que su beb se mueve normalmente.  Si no lo hace, consgale algo de comer y beber, recustese y concntrese en sentir cmo se mueve su beb.  Deberas sentir al menos 10 movimientos en 2 horas.  Si no lo hace, debe llamar al [facility] o ir al [facility].   Si an no lo ha hecho, regstrese en My Chart para tener fcil acceso a los resultados de sus laboratorios y Engineer, materials con su mdico de Building services engineer.  Debe regresar a Armed forces logistics/support/administrative officer en 1 semana (on 07/22/2023) at 11am  Le recomiendo que siempre traiga sus medicamentos a cada cita, ya que esto facilita garantizar que est tomando los medicamentos correctos y nos ayuda a no perder resurtidos cuando los necesite.  Llegue 15 minutos antes de su cita para garantizar un proceso de registro sin problemas.  Apreciamos sus esfuerzos para que esto suceda.  Llame a la clnica al [phone] si sus sntomas empeoran o tiene alguna inquietud.  Gracias por permitirme participar en su cuidado, Dr. Shirlean Schlein 16/06/2023 09:25 PGY-3, Medicina familiar de [place]    It was great to see you today! Thank you for choosing [facility] for your obstetric care. Jillian Turner was seen for ob visit.  We will continue seeing you once weekly now!  We will be checking GBS swabs and gonorrhea and chlamydia testing today or in the coming couple of weeks, will call or message with these results  Your birth plan is very important to [place], please give [place] details as they change  You can start kick counting as well, should feel 3-4 kicks in 1 hour or 10 in 2 hours. Only do this if you have become concerned that baby is not moving   Newborn Information  baby needs to sleep in bassinet after birth, with tight sheet that is fitted and no stuffed animals or other item  in the area to prevent suffocation, try to avoid others kissing baby and inform them of shaken baby syndrome concerns , Fed is best!! they should never go more than 3 hours in the day or 4 hours at night without feeding , and We will talk about sick care once baby is born   Call your OB Clinic or go to [facility] if: You begin to have strong, frequent contractions Your water breaks.  Sometimes it is a big gush of fluid, sometimes it is just a trickle that keeps getting your panties wet or running down your legs You have vaginal bleeding.  It is normal to have a  small amount of spotting if your cervix was checked.  You don't feel your baby moving like normal.  If you don't, get you something to eat and drink and lay down and focus on feeling your baby move.  You should feel at least 10 movements in 2 hours.  If you don't, you should call the office or go to [facility].   If you haven't already, sign up for My Chart to have easy access to your labs results, and communication with your primary care physician.  You should return to our clinic Return in 1 week (on 07/22/2023) at 11am  I recommend that you always bring your medications to each appointment as this makes it easy to ensure you are on the correct medications and helps [place] not miss refills when you need them.  Please arrive 15 minutes before your appointment to ensure smooth check in process.  We appreciate your efforts in making this happen.  Please call the clinic at [phone] if your symptoms worsen or you have any concerns.  Thank you for allowing me to participate in your care, Jillian Simon, MD 07/13/2023, 9:25 AM PGY-3, [facility]

## 2023-07-13 NOTE — Progress Notes (Addendum)
  [facility] Prenatal Visit  Jillian Turner is a 37 y.o. G3P2002 at [redacted]w[redacted]d here for routine follow up. She is dated by midtrimester ultrasound.  She reports no bleeding, no contractions, no cramping, and no leaking. She reports fetal movement. She denies vaginal bleeding, contractions, or loss of fluid. See flow sheet for details.  Vitals:   07/13/23 0845  BP: 129/82  Pulse: 100  FHT: 158 bpm Fundal height: 36cm  General: Alert, well appearing, NAD HEENT: Atraumatic, MMM, No sclera icterus CV: RRR, no murmurs, normal S1/S2 Pulm: CTAB, good WOB on RA, no crackles or wheezing Abd: Soft, no distension, no tenderness Skin: dry, warm Ext: No BLE edema Genitalia:  Normal introitus for age, no external lesions,  copious vaginal discharge with mild odor, no vaginal or cervical lesions. Vertex position. Closed and thick cervix  Chaperone for exam: Tashira CMA  A/P: Pregnancy at [redacted]w[redacted]d.  Doing well.   Routine prenatal care  Dating reviewed, dating tab is correct Fetal heart tones Appropriate 158 bpm Fundal height within expected range.  Fetal position confirmed Vertex using Ultrasound .  GBS collected today. .  Repeat GC/CT collected today.  The patient does not have a history of HSV and valacyclovir is not indicated at this time.  Infant feeding choice: Both  Contraception choice: Tubal ligation, forms to be signed at 32 weeks  Infant circumcision desired undecided Influenza vaccine not administered as not influenza season.   Tdap previously administered between 27-36 weeks  at [facility] department  COVID vaccination was discussed and declined.  Pregnancy education regarding preterm labor, fetal movement,  benefits of breastfeeding, contraception, fetal growth, expected weight gain, and safe infant sleep were discussed.    2. Pregnancy issues include the following and were addressed as appropriate today:  GC and GBS samples collected today BLTs form completed  already Reviewed signs and symptoms that would warrant ED visits Scheduled for faculty visit of 07/22/2023 at 11 AM.   Problem list and pregnancy box updated: Yes.  Follow up 1 week.

## 2023-07-13 NOTE — Addendum Note (Signed)
Addended by: Georges Lynch T on: 07/13/2023 10:18 AM   Modules accepted: Orders

## 2023-07-14 LAB — CERVICOVAGINAL ANCILLARY ONLY
Chlamydia: NEGATIVE
Comment: NEGATIVE
Comment: NORMAL
Neisseria Gonorrhea: NEGATIVE

## 2023-07-17 LAB — CULTURE, BETA STREP (GROUP B ONLY): Strep Gp B Culture: NEGATIVE

## 2023-07-22 ENCOUNTER — Ambulatory Visit (INDEPENDENT_AMBULATORY_CARE_PROVIDER_SITE_OTHER): Payer: Self-pay | Admitting: Family Medicine

## 2023-07-22 ENCOUNTER — Other Ambulatory Visit: Payer: Self-pay

## 2023-07-22 VITALS — BP 124/72 | HR 93 | Wt 146.2 lb

## 2023-07-22 DIAGNOSIS — Z3A37 37 weeks gestation of pregnancy: Secondary | ICD-10-CM

## 2023-07-22 DIAGNOSIS — Z3483 Encounter for supervision of other normal pregnancy, third trimester: Secondary | ICD-10-CM

## 2023-07-22 DIAGNOSIS — O99013 Anemia complicating pregnancy, third trimester: Secondary | ICD-10-CM

## 2023-07-22 NOTE — Patient Instructions (Signed)
Tercer trimestre de embarazo Third Trimester of Pregnancy  El tercer trimestre de embarazo va desde la semana 28 hasta la semana 40. Esto corresponde a los meses 7 a 9. El tercer trimestre es un perodo en el que el beb en gestacin (feto) crece rpidamente. Hacia el final del noveno mes, el feto mide alrededor de 20pulgadas (45cm) de largo y pesa entre 6 y 10 libras (2.7 y 4.5kg). Cambios en el cuerpo durante el tercer trimestre Durante el tercer trimestre, su cuerpo contina experimentando numerosos cambios. Los cambios varan y generalmente vuelven a la normalidad despus del nacimiento del beb. Cambios fsicos Seguir aumentando de peso. Es de esperar que aumente entre 25 y 35libras (11 y 16kg) hacia el final del embarazo si inicia el embarazo con un peso normal. Si tiene bajo peso, es de esperar que aumente entre 28 y 40 libras (13 y18 kg), y si tiene sobrepeso, es de esperar que aumente entre 15 y 25 libras (7 y 11kg). Podrn aparecer las primeras estras en las caderas, el abdomen y las mamas. Las mamas seguirn creciendo y pueden doler. Un lquido amarillo (calostro) puede salir de sus pechos. Esta es la primera leche que usted produce para su beb. Tal vez haya cambios en el cabello. Esto cambios pueden incluir su engrosamiento, crecimiento rpido y cambios en la textura. A algunas personas tambin se les cae el cabello durante o despus del embarazo, o tienen el cabello seco o fino. El ombligo puede salir hacia afuera. Puede observar que se le hinchan las manos, el rostro o los tobillos. Cambios en la salud Es posible que tenga acidez estomacal. Puede sufrir estreimiento. Puede desarrollar hemorroides. Puede desarrollar venas hinchadas y abultadas (venas varicosas) en las piernas. Puede presentar ms dolor en la pelvis, la espalda o los muslos. Esto se debe al aumento de peso y al aumento de las hormonas que relajan las articulaciones. Puede presentar un aumento del hormigueo o  entumecimiento en las manos, brazos y piernas. La piel de su abdomen tambin puede sentirse entumecida. Puede sentir que le falta el aire debido a que se expande el tero. Otros cambios Puede tener necesidad de orinar con ms frecuencia porque el feto baja hacia la pelvis y ejerce presin sobre la vejiga. Puede tener ms problemas para dormir. Esto puede deberse al tamao de su abdomen, una mayor necesidad de orinar y un aumento en el metabolismo de su cuerpo. Puede notar que el feto "baja" o lo siente ms bajo, en el abdomen (aligeramiento). Puede tener un aumento de la secrecin vaginal. Puede notar que tiene dolor alrededor del hueso plvico a medida que el tero se distiende. Siga estas instrucciones en su casa: Medicamentos Siga las instrucciones del mdico en relacin con el uso de medicamentos. Durante el embarazo, hay medicamentos que pueden tomarse y otros que no. No tome ningn medicamento a menos que lo haya autorizado el mdico. Tome vitaminas prenatales que contengan por lo menos 600microgramos (mcg) de cido flico. Comida y bebida Lleve una dieta saludable que incluya frutas y verduras frescas, cereales integrales, buenas fuentes de protenas como carnes magras, huevos o tofu, y productos lcteos descremados. Evite la carne cruda y el jugo, la leche y el queso sin pasteurizar. Estos portan grmenes que pueden provocar dao tanto a usted como al beb. Tome 4 o 5 comidas pequeas en lugar de 3 comidas abundantes al da. Es posible que tenga que tomar estas medidas para prevenir o tratar el estreimiento: Beber suficiente lquido como para mantener la   orina de color amarillo plido. Consumir alimentos ricos en fibra, como frijoles, cereales integrales, y frutas y verduras frescas. Limitar el consumo de alimentos ricos en grasa y azcares procesados, como los alimentos fritos o dulces. Actividad Haga ejercicio solamente como se lo haya indicado el mdico. La mayora de las personas  pueden continuar su actividad fsica habitual durante el embarazo. Intente realizar como mnimo 30minutos de actividad fsica por lo menos 5das a la semana. Deje de hacer ejercicio si experimenta contracciones en el tero. Deje de hacer ejercicio si le aparecen dolor o clicos en la parte baja del vientre o de la espalda. Evite levantar pesos excesivos. No haga ejercicio si hace mucho calor o humedad, o si se encuentra a una altitud elevada. Si lo desea, puede seguir teniendo relaciones sexuales, salvo que el mdico le indique lo contrario. Alivio del dolor y del malestar Haga pausas frecuentes y descanse con las piernas levantadas (elevadas) si tiene calambres en las piernas o dolor en la parte baja de la espalda. Dese baos de asiento con agua tibia para aliviar el dolor o las molestias causadas por las hemorroides. Use una crema para las hemorroides si el mdico la autoriza. Use un sujetador que le brinde buen soporte para prevenir las molestias causadas por la sensibilidad en las mamas. Si tiene venas varicosas: Use medias de compresin como se lo haya indicado el mdico. Eleve los pies durante 15minutos, 3 o 4veces por da. Limite el consumo de sal en su dieta. Seguridad Hable con su mdico antes de viajar distancias largas. No se d baos de inmersin en agua caliente, baos turcos ni saunas. Use el cinturn de seguridad en todo momento mientras conduce o va en auto. Hable con el mdico si es vctima de maltrato verbal o fsico. Preparacin para el nacimiento Para prepararse para la llegada de su beb: Tome clases prenatales para entender, practicar, y hacer preguntas sobre el trabajo de parto y el parto. Visite el hospital y recorra el rea de maternidad. Compre un asiento de seguridad orientado hacia atrs, y asegrese de saber cmo instalarlo en su automvil. Prepare la habitacin o el lugar donde dormir el beb. Asegrese de quitar todas las almohadas y animales de peluche de  la cuna del beb para evitar la asfixia. Indicaciones generales Evite el contacto con las bandejas sanitarias de los gatos y la tierra que estos animales usan. Estos alimentos contienen bacterias que pueden causar defectos congnitos en el beb. Si tiene un gato, pdale a alguien que limpie la caja de arena por usted. No se haga lavados vaginales ni use tampones. No use toallas higinicas perfumadas. No consuma ningn producto que contenga nicotina o tabaco, como cigarrillos, cigarrillos electrnicos y tabaco de mascar. Si necesita ayuda para dejar de consumir estos productos, consulte al mdico. No use ningn remedio a base de hierbas, drogas ilegales o medicamentos que no le hayan sido recetados. Las sustancias qumicas de estos productos pueden daar al beb. No beba alcohol. Le realizarn exmenes prenatales ms frecuentes durante el tercer trimestre. Durante una visita prenatal de rutina, el mdico le har un examen fsico, le realizar pruebas y hablar con usted de su salud general. Cumpla con todas las visitas de seguimiento. Esto es importante. Dnde buscar ms informacin American Pregnancy Association (Asociacin Estadounidense del Embarazo): americanpregnancy.org American College of Obstetricians and Gynecologists (Colegio Estadounidense de Obstetras y Gineclogos): acog.org/womens-health/pregnancy? Office on Women's Health (Oficina para la Salud de la Mujer): womenshealth.gov/pregnancy Comunquese con un mdico si tiene: Fiebre. Clicos leves en   la pelvis, presin en la pelvis o dolor persistente en la zona abdominal o la parte baja de la espalda. Vmitos o diarrea. Secrecin vaginal con mal olor u orina con mal olor. Dolor al orinar. Un dolor de cabeza que no desaparece despus de tomar analgsicos. Cambios en la visin o ve manchas delante de los ojos. Solicite ayuda de inmediato si: Rompe la bolsa. Tiene contracciones regulares separadas por menos de 5minutos. Tiene sangrado o  pequeas prdidas vaginales. Siente un dolor abdominal intenso. Tiene dificultad para respirar. Siente dolor en el pecho. Sufre episodios de desmayo. No ha sentido a su beb moverse durante el perodo de tiempo que le indic el mdico. Tiene dolor, hinchazn o enrojecimiento nuevos en un brazo o una pierna o se produce un aumento de alguno de estos sntomas. Resumen El tercer trimestre del embarazo comprende desde la semana28 hasta la semana 40 (desde el mes7 hasta el mes9). Puede tener ms problemas para dormir. Esto puede deberse al tamao de su abdomen, una mayor necesidad de orinar y un aumento en el metabolismo de su cuerpo. Le realizarn exmenes prenatales ms frecuentes durante el tercer trimestre. Cumpla con todas las visitas de seguimiento. Esto es importante. Esta informacin no tiene como fin reemplazar el consejo del mdico. Asegrese de hacerle al mdico cualquier pregunta que tenga. Document Revised: 06/21/2020 Document Reviewed: 06/21/2020 Elsevier Patient Education  2024 Elsevier Inc.  

## 2023-07-23 NOTE — Progress Notes (Signed)
[facility] Visit  Abelina Lacoria Zawada is a 37 y.o. G3P2002 at [redacted]w[redacted]d (via LMP) who presents to [facility] for routine follow up. Prenatal course, history, notes, ultrasounds, and laboratory results reviewed.  Denies regularly occurring cramping/ctx, fluid leaking, vaginal bleeding, or decreased fetal movement. Taking PNV.    Postpartum Plans: - delivery planning: SVD with epidural - circumcision: no - feeding: breast & formula - pediatrician: [facility] - contraception: BTL - has already prepaid for tubal (7/16)  FHR: 163 bpm Uterine size: 36cm Initial FHR in low 170s, but on further listening baseline seemed low 160s, suspect heard accel initially rather than baseline in 170s  Assessment & Plan  1. Routine prenatal care: - doing well overall, reviewed labor & fetal movement precautions - continue PNV - vertex position previously confirmed with POCUS at last visit, leopolds today continues to suggest vertex   2. Desire for BTL - has prepaid for BTL, anticipate this done after delivery while inpatient    3. Anemia - Hgb 10.8 in third trimester. Taking iron. History of postpartum hemorrhage (in setting of IOL for oligo, manual extraction of placenta) - recheck CBC & ferritin today, titrate iron as needed based on results  4. Tachycardia - resolved today, patient doing well, prior TSH normal   Next prenatal visit in 1 week. Will need IOL scheduled soon for 41w, as well as BPP around [redacted]w[redacted]d.  Levert Feinstein, MD, [facility]

## 2023-07-28 ENCOUNTER — Ambulatory Visit (INDEPENDENT_AMBULATORY_CARE_PROVIDER_SITE_OTHER): Payer: Self-pay | Admitting: Family Medicine

## 2023-07-28 ENCOUNTER — Telehealth: Payer: Self-pay

## 2023-07-28 ENCOUNTER — Other Ambulatory Visit: Payer: Self-pay

## 2023-07-28 ENCOUNTER — Encounter: Payer: Self-pay | Admitting: Family Medicine

## 2023-07-28 VITALS — BP 122/78 | HR 101 | Wt 147.8 lb

## 2023-07-28 DIAGNOSIS — Z3483 Encounter for supervision of other normal pregnancy, third trimester: Secondary | ICD-10-CM

## 2023-07-28 NOTE — Progress Notes (Signed)
  [facility] Prenatal Visit  Sherryll Skoczylas Jillian Turner is a 37 y.o. G3P2002 at [redacted]w[redacted]d here for routine follow up. She is dated by LMP.  She reports no complaints. She reports fetal movement. She denies vaginal bleeding, contractions, or loss of fluid. See flow sheet for details.  - 136 bpm - 36cm   Vitals:   07/28/23 0854  BP: 122/78  Pulse: (!) 101    A/P: Pregnancy at [redacted]w[redacted]d.  Doing well.   Routine prenatal care:  Dating reviewed, dating tab is correct Fetal heart tones Appropriate Fundal height within expected range.  Fetal position confirmed Vertex using U/S on 7/16.  Infant feeding choice: Both  Contraception choice: Tubal ligation, forms to be signed at 32 weeks  Infant circumcision desired no Influenza vaccine not administered as not influenza season.   Tdap previously administered between 27-36 weeks  GBS and gc/chlamydia testing results were reviewed today.   Pregnancy education regarding labor, fetal movement,  benefits of breastfeeding, contraception, and safe infant sleep were discussed.  Labor and fetal movement precautions reviewed. Induction of labor discussed. Scheduled for induction at approximately 41 weeks. BPP scheduled between 40-41 weeks.   2. Pregnancy issues include the following and were addressed as appropriate today:   Induction for postdates was scheduled 8/19 and BPP is 8/13  Problem list and pregnancy box updated: Yes.   Follow up 1 week.

## 2023-07-28 NOTE — Telephone Encounter (Signed)
Also attempted to reach patient via interpreter to review CBC results from last visit. No answer. Left HIPAA-compliant voicemail asking her to call back.  When she returns call please let her know I'd like her to go up on the iron to every day (rather than every other day) to help boost her blood counts prior to delivery.  I am happy to speak with her if she has questions.  Thanks Latrelle Dodrill, MD

## 2023-07-28 NOTE — Patient Instructions (Addendum)
Programe una visita de seguimiento en 1 semanas.  Lo programar para un BPP (para observar el nivel del beb y del lquido amnitico) alrededor del 13/08/24.  Si todava est embarazada el 19/08/24, tambin he programado una induccin del parto en ese momento.  I will schedule you for a BPP (to look at baby and amniotic fluid level) around 08/10/23.  If you are still pregnant on 08/16/23, I have also scheduled an induction of labor at that time.   Prenatal Classes Go to [email or web address]  Precauciones de devolucin relacionadas con Reginia Naas  Los siguientes son signos/sntomas que son anormales en el Psychiatrist y pueden requerir una evaluacin adicional por parte de un mdico: Vaya a MAU en [facility] [place] si: Tiene calambres/contracciones que no desaparecen con agua potable, especialmente si duran de 30 segundos a 1,5 minutos, aparecen y desaparecen cada 5-10 minutos durante una hora o ms, o si son cada vez ms fuertes y no puede caminar o Physiological scientist. Tu agua se rompe. A veces es un gran chorro de lquido, a veces es solo un goteo que sigue mojando tu ropa interior o corriendo por tus piernas. Tiene sangrado vaginal. No siente que su beb se mueva normalmente. Si no lo hace, busque algo para comer y beber (algo fro o algo con azcar [name] de man o [name]) y acustese y concntrese en sentir cmo se mueve su beb. Si su beb todava no se mueve con normalidad, debe ir a MAU. Debe sentir que su beb se mueve 6 veces en una hora o 10 veces en dos horas. Tiene un dolor de cabeza persistente que no desaparece con 1 g de Tylenol, cambios en la visin, dolor en el pecho, dificultad para respirar, dolor intenso en la parte superior derecha del abdomen, empeoramiento de la hinchazn de las piernas; todos estos pueden ser signos de presin arterial alta en el embarazo y necesita para ser evaluado  por un proveedor inmediatamente  [place] son preocupantes durante el Psychiatrist y, si tiene alguno de [place], le recomiendo que llame a su PCP y se presente a Furniture conservator/restorer de Admisiones de Maternidad (mapa a continuacin) para [name].  Para cualquier emergencia relacionada con [name], dirjase a la [place] de Admisiones de Maternidad en [address] de [place] y [facility]. Usar la Entrada C del hospital.

## 2023-07-28 NOTE — Telephone Encounter (Signed)
Attempted to reach patient through interpreter Lars Mage [phone]. No answer. LVM informing of BPP on Tue. Aug 13th at 1:15p. At [address] Aquilla Solian, CMA

## 2023-08-04 ENCOUNTER — Ambulatory Visit (INDEPENDENT_AMBULATORY_CARE_PROVIDER_SITE_OTHER): Payer: Medicaid Other | Admitting: Student

## 2023-08-04 ENCOUNTER — Other Ambulatory Visit: Payer: Self-pay

## 2023-08-04 VITALS — BP 126/89 | HR 93 | Temp 97.5°F | Wt 148.4 lb

## 2023-08-04 DIAGNOSIS — Z3A39 39 weeks gestation of pregnancy: Secondary | ICD-10-CM

## 2023-08-04 DIAGNOSIS — Z3483 Encounter for supervision of other normal pregnancy, third trimester: Secondary | ICD-10-CM | POA: Diagnosis not present

## 2023-08-04 NOTE — Progress Notes (Signed)
  [facility] Prenatal Visit  Jillian Turner is a 37 y.o. G3P2002 at [redacted]w[redacted]d here for routine follow up. She is dated by LMP.  She reports no complaints. She reports fetal movement. She denies vaginal bleeding, contractions, or loss of fluid. See flow sheet for details.  There were no vitals filed for this visit.  A/P: Pregnancy at [redacted]w[redacted]d.  Doing well.   Routine prenatal care:  Dating reviewed, dating tab is correct Fetal heart tones Appropriate 128 Fundal height within expected range.  Fetal position confirmed Vertex using Ultrasound .  Infant feeding choice: Both  Contraception choice: Tubal ligation, forms to be signed at 32 weeks  Infant circumcision desired undecided Influenza vaccine not administered as not influenza season.   Tdap previously administered between 27-36 weeks  GBS and gc/chlamydia testing results were reviewed today.   Pregnancy education regarding labor, fetal movement,  benefits of breastfeeding, contraception, and safe infant sleep were discussed.  Labor and fetal movement precautions reviewed. Induction of labor discussed. Scheduled for induction at approximately 41 weeks. BPP scheduled between 40-41 weeks.   2. Pregnancy issues include the following and were addressed as appropriate today:   PDIOL scheduled for 8/19 and BPP on 8/13.   Problem list and pregnancy box updated: Yes.   Follow up 1 week.

## 2023-08-04 NOTE — Patient Instructions (Signed)
Programe una visita de seguimiento en 1 semanas.  Prenatal Classes Go to [email or web address]  Precauciones de devolucin relacionadas con el embarazo  Los siguientes son signos/sntomas que son anormales en el embarazo y pueden requerir una evaluacin adicional por parte de un mdico: Vaya a MAU en [facility] en [place] si: Tiene calambres/contracciones que no desaparecen con agua potable, especialmente si duran de 30 segundos a 1,5 minutos, aparecen y desaparecen cada 5-10 minutos durante una hora o ms, o si son cada vez ms fuertes y no puede caminar o hablar mientras tener una contraccin/calambre. Tu agua se rompe. A veces es un gran chorro de lquido, a veces es solo un goteo que sigue mojando tu ropa interior o corriendo por tus piernas. Tiene sangrado vaginal. No siente que su beb se mueva normalmente. Si no lo hace, busque algo para comer y beber (algo fro o algo con azcar como mantequilla de man o jugo) y acustese y concntrese en sentir cmo se mueve su beb. Si su beb todava no se mueve con normalidad, debe ir a MAU. Debe sentir que su beb se mueve 6 veces en una hora o 10 veces en dos horas. Tiene un dolor de cabeza persistente que no desaparece con 1 g de Tylenol, cambios en la visin, dolor en el pecho, dificultad para respirar, dolor intenso en la parte superior derecha del abdomen, empeoramiento de la hinchazn de las piernas; todos estos pueden ser signos de presin arterial alta en el embarazo y necesita para ser evaluado por un proveedor inmediatamente  Todos estos son preocupantes durante el embarazo y, si tiene alguno de estos, le recomiendo que llame a su PCP y se presente a la Unidad de Admisiones de Maternidad (mapa a continuacin) para una evaluacin adicional.  Para cualquier emergencia relacionada con el embarazo, dirjase a la Unidad de Admisiones de Maternidad en el [facility] en el [facility]  [place]. Usar la Entrada C del hospital.    

## 2023-08-10 ENCOUNTER — Ambulatory Visit: Payer: Medicaid Other | Attending: Family Medicine

## 2023-08-10 ENCOUNTER — Ambulatory Visit: Payer: Self-pay | Admitting: *Deleted

## 2023-08-10 ENCOUNTER — Inpatient Hospital Stay (HOSPITAL_COMMUNITY)
Admission: AD | Admit: 2023-08-10 | Discharge: 2023-08-12 | DRG: 798 | Disposition: A | Discharge status: Home / Self Care | Payer: MEDICAID | Source: Ambulatory Visit | Attending: Obstetrics and Gynecology | Admitting: Obstetrics and Gynecology

## 2023-08-10 ENCOUNTER — Encounter (HOSPITAL_COMMUNITY): Payer: Self-pay | Admitting: Family Medicine

## 2023-08-10 ENCOUNTER — Encounter: Payer: Self-pay | Admitting: *Deleted

## 2023-08-10 ENCOUNTER — Other Ambulatory Visit: Payer: Self-pay

## 2023-08-10 DIAGNOSIS — Z3483 Encounter for supervision of other normal pregnancy, third trimester: Principal | ICD-10-CM

## 2023-08-10 DIAGNOSIS — O480 Post-term pregnancy: Principal | ICD-10-CM | POA: Diagnosis present

## 2023-08-10 DIAGNOSIS — Z8759 Personal history of other complications of pregnancy, childbirth and the puerperium: Secondary | ICD-10-CM

## 2023-08-10 DIAGNOSIS — Z3A40 40 weeks gestation of pregnancy: Secondary | ICD-10-CM | POA: Insufficient documentation

## 2023-08-10 DIAGNOSIS — Z603 Acculturation difficulty: Secondary | ICD-10-CM | POA: Diagnosis present

## 2023-08-10 DIAGNOSIS — O0990 Supervision of high risk pregnancy, unspecified, unspecified trimester: Secondary | ICD-10-CM | POA: Insufficient documentation

## 2023-08-10 DIAGNOSIS — Z302 Encounter for sterilization: Secondary | ICD-10-CM

## 2023-08-10 DIAGNOSIS — Z3493 Encounter for supervision of normal pregnancy, unspecified, third trimester: Secondary | ICD-10-CM

## 2023-08-10 DIAGNOSIS — Z758 Other problems related to medical facilities and other health care: Secondary | ICD-10-CM | POA: Diagnosis present

## 2023-08-10 DIAGNOSIS — O09523 Supervision of elderly multigravida, third trimester: Secondary | ICD-10-CM | POA: Insufficient documentation

## 2023-08-10 LAB — PREPARE RBC (CROSSMATCH)

## 2023-08-10 LAB — CBC
HCT: 36 % (ref 36.0–46.0)
Hemoglobin: 11.7 g/dL — ABNORMAL LOW (ref 12.0–15.0)
MCH: 26.2 pg (ref 26.0–34.0)
MCHC: 32.5 g/dL (ref 30.0–36.0)
MCV: 80.7 fL (ref 80.0–100.0)
Platelets: 394 10*3/uL (ref 150–400)
RBC: 4.46 MIL/uL (ref 3.87–5.11)
RDW: 16.2 % — ABNORMAL HIGH (ref 11.5–15.5)
WBC: 10.2 10*3/uL (ref 4.0–10.5)
nRBC: 0 % (ref 0.0–0.2)

## 2023-08-10 LAB — BPAM RBC
Blood Product Expiration Date: 202409062359
Blood Product Expiration Date: 202409062359
ISSUE DATE / TIME: 202408070848
ISSUE DATE / TIME: 202408070848
Unit Type and Rh: 5100
Unit Type and Rh: 5100

## 2023-08-10 LAB — TYPE AND SCREEN
ABO/RH(D): O POS
Antibody Screen: NEGATIVE
Unit division: 0
Unit division: 0

## 2023-08-10 MED ORDER — LACTATED RINGERS IV SOLN: INTRAVENOUS | Status: DC

## 2023-08-10 MED ORDER — LIDOCAINE HCL (PF) 1 % IJ SOLN: 30.0000 mL | INTRAMUSCULAR | Status: DC | PRN

## 2023-08-10 MED ORDER — MISOPROSTOL 25 MCG QUARTER TABLET
25.0000 ug | ORAL_TABLET | Freq: Once | ORAL | Status: AC
  Administered 2023-08-10: 25 ug via VAGINAL
  Filled 2023-08-10: qty 1

## 2023-08-10 MED ORDER — OXYCODONE-ACETAMINOPHEN 5-325 MG PO TABS: 2.0000 | ORAL_TABLET | ORAL | Status: DC | PRN

## 2023-08-10 MED ORDER — TERBUTALINE SULFATE 1 MG/ML IJ SOLN: 0.2500 mg | Freq: Once | INTRAMUSCULAR | Status: DC | PRN

## 2023-08-10 MED ORDER — SOD CITRATE-CITRIC ACID 500-334 MG/5ML PO SOLN: 30.0000 mL | ORAL | Status: DC | PRN

## 2023-08-10 MED ORDER — OXYCODONE-ACETAMINOPHEN 5-325 MG PO TABS: 1.0000 | ORAL_TABLET | ORAL | Status: DC | PRN

## 2023-08-10 MED ORDER — ONDANSETRON HCL 4 MG/2ML IJ SOLN: 4.0000 mg | Freq: Four times a day (QID) | INTRAMUSCULAR | Status: DC | PRN

## 2023-08-10 MED ORDER — LACTATED RINGERS IV SOLN: 500.0000 mL | INTRAVENOUS | Status: DC | PRN

## 2023-08-10 MED ORDER — ACETAMINOPHEN 325 MG PO TABS: 650.0000 mg | ORAL_TABLET | ORAL | Status: DC | PRN

## 2023-08-10 MED ORDER — OXYTOCIN-SODIUM CHLORIDE 30-0.9 UT/500ML-% IV SOLN: 2.5000 [IU]/h | INTRAVENOUS | Status: DC

## 2023-08-10 MED ORDER — OXYTOCIN-SODIUM CHLORIDE 30-0.9 UT/500ML-% IV SOLN
2.5000 [IU]/h | INTRAVENOUS | Status: DC
  Filled 2023-08-10: qty 500

## 2023-08-10 MED ORDER — ONDANSETRON HCL 4 MG/2ML IJ SOLN
4.0000 mg | Freq: Four times a day (QID) | INTRAMUSCULAR | Status: DC | PRN
  Administered 2023-08-11: 4 mg via INTRAVENOUS
  Filled 2023-08-10: qty 2

## 2023-08-10 MED ORDER — MISOPROSTOL 50MCG HALF TABLET
50.0000 ug | ORAL_TABLET | Freq: Once | ORAL | Status: AC
  Administered 2023-08-10: 50 ug via ORAL
  Filled 2023-08-10: qty 1

## 2023-08-10 MED ORDER — OXYTOCIN BOLUS FROM INFUSION
333.0000 mL | Freq: Once | INTRAVENOUS | Status: AC
  Administered 2023-08-11: 333 mL via INTRAVENOUS

## 2023-08-10 MED ORDER — OXYTOCIN-SODIUM CHLORIDE 30-0.9 UT/500ML-% IV SOLN
1.0000 m[IU]/min | INTRAVENOUS | Status: DC
  Administered 2023-08-10: 2 m[IU]/min via INTRAVENOUS

## 2023-08-10 MED ORDER — OXYTOCIN BOLUS FROM INFUSION: 333.0000 mL | Freq: Once | INTRAVENOUS | Status: DC

## 2023-08-10 NOTE — H&P (Signed)
LABOR AND DELIVERY ADMISSION HISTORY AND PHYSICAL NOTE  Zarah Boys is a 37 y.o. female G3P2002 with IUP at 101w1d presenting for IOL for BPP 4/8.   Patient reports the fetal movement as active. Patient reports uterine contraction  activity as regular. Patient reports  vaginal bleeding as none. Patient describes fluid per vagina as None.   Patient denies headache, vision changes, chest pain, shortness of breath, right upper quadrant pain, or LE edema.  She plans on breast feeding and bottle feeding feeding. Her contraception plan is: bilateral tubal ligation.  Prenatal History/Complications: PNC at [facility]  Sono:  @[redacted]w[redacted]d , CWD, normal anatomy, breech presentation, posterior placenta, 61%ile, EFW 779 g  Pregnancy complications:  Patient Active Problem List   Diagnosis Date Noted   Pregnant and not yet delivered in third trimester 08/10/2023   Supervision of high-risk pregnancy 08/10/2023   Tachycardia 06/07/2023   Language barrier 05/27/2023   Encounter for supervision of other normal pregnancy, third trimester 04/19/2023   History of postpartum hemorrhage 02/02/2023    Past Medical History: Past Medical History:  Diagnosis Date   Medical history non-contributory    Varicosities    bilateral legs    Past Surgical History: Past Surgical History:  Procedure Laterality Date   NO PAST SURGERIES      Obstetrical History: OB History     Gravida  3   Para  2   Term  2   Preterm      AB      Living  2      SAB      IAB      Ectopic      Multiple  0   Live Births  1           Social History: Social History   Socioeconomic History   Marital status: Married    Spouse name: Not on file   Number of children: Not on file   Years of education: Not on file   Highest education level: Not on file  Occupational History   Not on file  Tobacco Use   Smoking status: Never   Smokeless tobacco: Never  Substance and Sexual Activity   Alcohol use: No    Drug use: No   Sexual activity: Yes    Birth control/protection: Surgical  Other Topics Concern   Not on file  Social History Narrative   Not on file   Social Determinants of Health   Financial Resource Strain: Not on file  Food Insecurity: No Food Insecurity (08/10/2023)   Hunger Vital Sign    Worried About Running Out of Food in the Last Year: Never true    Ran Out of Food in the Last Year: Never true  Transportation Needs: No Transportation Needs (08/10/2023)   PRAPARE - Administrator, Civil Service (Medical): No    Lack of Transportation (Non-Medical): No  Physical Activity: Not on file  Stress: Not on file  Social Connections: Not on file    Family History: Family History  Problem Relation Age of Onset   Kidney disease Brother     Allergies: No Known Allergies  Medications Prior to Admission  Medication Sig Dispense Refill Last Dose   ferrous sulfate 325 (65 FE) MG EC tablet Take 1 tablet (325 mg total) by mouth every other day. 90 tablet 3 08/09/2023   Prenatal Vit-Fe Fumarate-FA (PRENATAL MULTIVITAMIN) TABS tablet Take 1 tablet by mouth daily at 12 noon.   08/09/2023  Review of Systems  All systems reviewed and negative except as stated in HPI  Physical Exam BP (!) 147/91 (BP Location: Left Arm)   Pulse 87   Temp 98.4 F (36.9 C) (Oral)   Resp 18   Ht 5\' 2"  (1.575 m)   Wt 67.4 kg   LMP 11/02/2022   BMI 27.16 kg/m   Physical Exam  Presentation: cephalic by ultrasound  Fetal monitoring: Baseline: 145 bpm, Variability: Good {> 6 bpm), Accelerations: Reactive, and Decelerations: Absent Uterine activity: Frequency: Every 3-4 minutes  Dilation: Closed Presentation: Vertex (by bedside ultrasound) Exam by:: Diannia Ruder, RN Prenatal labs: ABO, Rh: --/--/O POS (08/13 1538) Antibody: NEG (08/13 1538) Rubella: 8.43 (04/15 0941) RPR: Non Reactive (04/15 0941)  HBsAg: Negative (04/15 0941)  HIV: Non Reactive (04/15 0941)  GC/Chlamydia:   Neisseria Gonorrhea  Date Value Ref Range Status  07/13/2023 Negative  Final   Chlamydia  Date Value Ref Range Status  07/13/2023 Negative  Final   GBS: Negative/-- (07/16 1025)    Prenatal Transfer Tool  Maternal Diabetes: No Genetic Screening: Declined Maternal Ultrasounds/Referrals: Normal Fetal Ultrasounds or other Referrals:  None Maternal Substance Abuse:  No Significant Maternal Medications:  Meds include: Other:  iron Significant Maternal Lab Results: Group B Strep negative  Results for orders placed or performed during the hospital encounter of 08/10/23 (from the past 24 hour(s))  Type and screen [facility]   Collection Time: 08/10/23  3:38 PM  Result Value Ref Range   ABO/RH(D) O POS    Antibody Screen NEG    Sample Expiration 08/13/2023,2359    Unit Number [id number]    Blood Component Type RED CELLS,LR    Unit division 00    Status of Unit ALLOCATED    Transfusion Status OK TO TRANSFUSE    Crossmatch Result      Compatible Performed at [facility], [address], [place], [place] [address]    Unit Number [id number]    Blood Component Type RED CELLS,LR    Unit division 00    Status of Unit ALLOCATED    Transfusion Status OK TO TRANSFUSE    Crossmatch Result Compatible   BPAM RBC   Collection Time: 08/10/23  3:38 PM  Result Value Ref Range   ISSUE DATE / TIME 102725366440    Blood Product Unit Number [id number]    PRODUCT CODE E0382V00    Unit Type and Rh 5100    Blood Product Expiration Date 643329518841    ISSUE DATE / TIME 660630160109    Blood Product Unit Number [id number]    PRODUCT CODE E0382V00    Unit Type and Rh 5100    Blood Product Expiration Date 270623762831   CBC   Collection Time: 08/10/23  3:39 PM  Result Value Ref Range   WBC 10.2 4.0 - 10.5 K/uL   RBC 4.46 3.87 - 5.11 MIL/uL   Hemoglobin 11.7 (L) 12.0 - 15.0 g/dL   HCT 51.7 61.6 - 07.3 %   MCV 80.7 80.0 - 100.0 fL   MCH 26.2 26.0 - 34.0  pg   MCHC 32.5 30.0 - 36.0 g/dL   RDW 71.0 (H) 62.6 - 94.8 %   Platelets 394 150 - 400 K/uL   nRBC 0.0 0.0 - 0.2 %  Prepare RBC (crossmatch)   Collection Time: 08/10/23  4:28 PM  Result Value Ref Range   Order Confirmation      ORDER PROCESSED BY BLOOD BANK Performed at [facility], [address] [place], [place] [address]     Assessment: Cayli Brehm is a 37 y.o. G3P2002 at [redacted]w[redacted]d here for IOL for BPP 4/8 at term.  #Labor: Initial CVE closed/thick. Will start with cytotec 50/25  #Pain: IV pain meds PRN, epidural upon request #FHT: Category I #GBS/ID: Negative #MOF: breast feeding and bottle feeding #MOC: bilateral tubal ligation #Circ: No    Para March, DO Center for [employer], [facility]  08/10/2023, 6:45 PM

## 2023-08-10 NOTE — Progress Notes (Signed)
Labor Progress Note Jillian Turner is a 37 y.o. G3P2002 at [redacted]w[redacted]d presented for  IOL for BPP 4/8.  S: Patient reports mild pain with contractions. Doing well.  O:  BP (!) 137/94 (BP Location: Left Arm)   Pulse 69   Temp 98.1 F (36.7 C) (Oral)   Resp 16   Ht 5\' 2"  (1.575 m)   Wt 67.4 kg   LMP 11/02/2022   BMI 27.16 kg/m  EFM: 150/mod variability/Accels present, no decels  CVE: Dilation: 3 Effacement (%): 50 Presentation: Vertex (by bedside ultrasound) Exam by:: Diannia Ruder, RN   A&P: 37 y.o. [id number] [redacted]w[redacted]d presented for  IOL for BPP 4/8. #Labor: Progressing well. Patient now 3/50/-3. Will augment with pitocin starting at 2x2 protocol. Plan for amniotomy in 2-3 hours once fetal station is lower. #Pain: Per patient request #FWB: Cat 1 #GBS negative  Matthew Folks Ryley Teater, DO 8:57 PM

## 2023-08-11 ENCOUNTER — Inpatient Hospital Stay (HOSPITAL_COMMUNITY): Payer: Medicaid Other

## 2023-08-11 ENCOUNTER — Inpatient Hospital Stay (HOSPITAL_COMMUNITY): Payer: Medicaid Other | Admitting: Anesthesiology

## 2023-08-11 ENCOUNTER — Encounter: Payer: Self-pay | Admitting: Student

## 2023-08-11 ENCOUNTER — Encounter (HOSPITAL_COMMUNITY)
Admission: AD | Disposition: A | Discharge status: Home / Self Care | Payer: Self-pay | Source: Ambulatory Visit | Attending: Obstetrics and Gynecology

## 2023-08-11 ENCOUNTER — Other Ambulatory Visit: Payer: Self-pay

## 2023-08-11 ENCOUNTER — Encounter (HOSPITAL_COMMUNITY): Payer: Self-pay | Admitting: Family Medicine

## 2023-08-11 ENCOUNTER — Inpatient Hospital Stay (HOSPITAL_COMMUNITY): Payer: MEDICAID

## 2023-08-11 DIAGNOSIS — O480 Post-term pregnancy: Secondary | ICD-10-CM

## 2023-08-11 DIAGNOSIS — Z3A40 40 weeks gestation of pregnancy: Secondary | ICD-10-CM

## 2023-08-11 DIAGNOSIS — Z302 Encounter for sterilization: Secondary | ICD-10-CM

## 2023-08-11 HISTORY — PX: TUBAL LIGATION: SHX77

## 2023-08-11 SURGERY — LIGATION, FALLOPIAN TUBE, POSTPARTUM
Anesthesia: Epidural

## 2023-08-11 MED ORDER — HYDROMORPHONE HCL 1 MG/ML IJ SOLN
0.2500 mg | INTRAMUSCULAR | Status: DC | PRN
  Administered 2023-08-11: 0.5 mg via INTRAVENOUS

## 2023-08-11 MED ORDER — ONDANSETRON HCL 4 MG/2ML IJ SOLN
INTRAMUSCULAR | Status: DC | PRN
  Administered 2023-08-11: 4 mg via INTRAVENOUS

## 2023-08-11 MED ORDER — SENNOSIDES-DOCUSATE SODIUM 8.6-50 MG PO TABS
2.0000 | ORAL_TABLET | Freq: Every day | ORAL | Status: DC
  Administered 2023-08-12: 2 via ORAL
  Filled 2023-08-11: qty 2

## 2023-08-11 MED ORDER — PROMETHAZINE HCL 25 MG/ML IJ SOLN: 6.2500 mg | INTRAMUSCULAR | Status: DC | PRN

## 2023-08-11 MED ORDER — DEXAMETHASONE SODIUM PHOSPHATE 10 MG/ML IJ SOLN
INTRAMUSCULAR | Status: DC | PRN
Start: 2023-08-11 — End: 2023-08-11
  Administered 2023-08-11: 10 mg via INTRAVENOUS

## 2023-08-11 MED ORDER — IBUPROFEN 600 MG PO TABS
600.0000 mg | ORAL_TABLET | Freq: Four times a day (QID) | ORAL | Status: DC
  Administered 2023-08-11 – 2023-08-12 (×4): 600 mg via ORAL
  Filled 2023-08-11 (×4): qty 1

## 2023-08-11 MED ORDER — LIDOCAINE HCL (PF) 1 % IJ SOLN
INTRAMUSCULAR | Status: DC | PRN
  Administered 2023-08-11: 11 mL via EPIDURAL

## 2023-08-11 MED ORDER — ONDANSETRON HCL 4 MG PO TABS: 4.0000 mg | ORAL_TABLET | ORAL | Status: DC | PRN

## 2023-08-11 MED ORDER — COCONUT OIL OIL: 1.0000 | TOPICAL_OIL | Status: DC | PRN

## 2023-08-11 MED ORDER — LIDOCAINE-EPINEPHRINE (PF) 2 %-1:200000 IJ SOLN
INTRAMUSCULAR | Status: AC
  Filled 2023-08-11: qty 20

## 2023-08-11 MED ORDER — LIDOCAINE-EPINEPHRINE (PF) 2 %-1:200000 IJ SOLN
INTRAMUSCULAR | Status: DC | PRN
  Administered 2023-08-11: 5 mL via EPIDURAL
  Administered 2023-08-11: 1 mL via EPIDURAL
  Administered 2023-08-11: 5 mL via EPIDURAL
  Administered 2023-08-11: 4 mL via EPIDURAL

## 2023-08-11 MED ORDER — METOCLOPRAMIDE HCL 10 MG PO TABS
10.0000 mg | ORAL_TABLET | Freq: Once | ORAL | Status: AC
  Administered 2023-08-11: 10 mg via ORAL
  Filled 2023-08-11 (×2): qty 1

## 2023-08-11 MED ORDER — FAMOTIDINE 20 MG PO TABS: 40.0000 mg | ORAL_TABLET | Freq: Once | ORAL | Status: DC

## 2023-08-11 MED ORDER — DIPHENHYDRAMINE HCL 50 MG/ML IJ SOLN: 12.5000 mg | INTRAMUSCULAR | Status: DC | PRN

## 2023-08-11 MED ORDER — ZOLPIDEM TARTRATE 5 MG PO TABS: 5.0000 mg | ORAL_TABLET | Freq: Every evening | ORAL | Status: DC | PRN

## 2023-08-11 MED ORDER — FENTANYL-BUPIVACAINE-NACL 0.5-0.125-0.9 MG/250ML-% EP SOLN
12.0000 mL/h | EPIDURAL | Status: DC | PRN
  Administered 2023-08-11: 12 mL/h via EPIDURAL
  Filled 2023-08-11: qty 250

## 2023-08-11 MED ORDER — LACTATED RINGERS IV SOLN: INTRAVENOUS | Status: DC

## 2023-08-11 MED ORDER — HYDROMORPHONE HCL 1 MG/ML IJ SOLN
INTRAMUSCULAR | Status: AC
  Filled 2023-08-11: qty 0.5

## 2023-08-11 MED ORDER — BUPIVACAINE HCL (PF) 0.25 % IJ SOLN
INTRAMUSCULAR | Status: AC
  Filled 2023-08-11: qty 30

## 2023-08-11 MED ORDER — DIPHENHYDRAMINE HCL 25 MG PO CAPS: 25.0000 mg | ORAL_CAPSULE | Freq: Four times a day (QID) | ORAL | Status: DC | PRN

## 2023-08-11 MED ORDER — ACETAMINOPHEN 10 MG/ML IV SOLN
INTRAVENOUS | Status: AC
  Filled 2023-08-11: qty 100

## 2023-08-11 MED ORDER — MEPERIDINE HCL 25 MG/ML IJ SOLN: 6.2500 mg | INTRAMUSCULAR | Status: DC | PRN

## 2023-08-11 MED ORDER — ONDANSETRON HCL 4 MG/2ML IJ SOLN: 4.0000 mg | INTRAMUSCULAR | Status: DC | PRN

## 2023-08-11 MED ORDER — ACETAMINOPHEN 10 MG/ML IV SOLN
INTRAVENOUS | Status: DC | PRN
  Administered 2023-08-11: 1000 mg via INTRAVENOUS

## 2023-08-11 MED ORDER — ACETAMINOPHEN 325 MG PO TABS
650.0000 mg | ORAL_TABLET | ORAL | Status: DC | PRN
  Administered 2023-08-11: 650 mg via ORAL
  Filled 2023-08-11: qty 2

## 2023-08-11 MED ORDER — PHENYLEPHRINE 80 MCG/ML (10ML) SYRINGE FOR IV PUSH (FOR BLOOD PRESSURE SUPPORT): 80.0000 ug | PREFILLED_SYRINGE | INTRAVENOUS | Status: DC | PRN

## 2023-08-11 MED ORDER — SIMETHICONE 80 MG PO CHEW: 80.0000 mg | CHEWABLE_TABLET | ORAL | Status: DC | PRN

## 2023-08-11 MED ORDER — OXYCODONE HCL 5 MG PO TABS: 5.0000 mg | ORAL_TABLET | ORAL | Status: DC | PRN

## 2023-08-11 MED ORDER — EPHEDRINE 5 MG/ML INJ: 10.0000 mg | INTRAVENOUS | Status: DC | PRN

## 2023-08-11 MED ORDER — METOCLOPRAMIDE HCL 10 MG PO TABS: 10.0000 mg | ORAL_TABLET | Freq: Once | ORAL | Status: DC

## 2023-08-11 MED ORDER — WITCH HAZEL-GLYCERIN EX PADS: 1.0000 | MEDICATED_PAD | CUTANEOUS | Status: DC | PRN

## 2023-08-11 MED ORDER — DEXAMETHASONE SODIUM PHOSPHATE 10 MG/ML IJ SOLN
INTRAMUSCULAR | Status: AC
  Filled 2023-08-11: qty 1

## 2023-08-11 MED ORDER — FAMOTIDINE 20 MG PO TABS
40.0000 mg | ORAL_TABLET | Freq: Once | ORAL | Status: AC
  Administered 2023-08-11: 40 mg via ORAL
  Filled 2023-08-11: qty 2

## 2023-08-11 MED ORDER — LACTATED RINGERS IV SOLN: 500.0000 mL | Freq: Once | INTRAVENOUS | Status: DC

## 2023-08-11 MED ORDER — BUPIVACAINE HCL 0.5 % IJ SOLN
INTRAMUSCULAR | Status: DC | PRN
  Administered 2023-08-11: 6 mL

## 2023-08-11 MED ORDER — DIBUCAINE (PERIANAL) 1 % EX OINT: 1.0000 | TOPICAL_OINTMENT | CUTANEOUS | Status: DC | PRN

## 2023-08-11 MED ORDER — BENZOCAINE-MENTHOL 20-0.5 % EX AERO
1.0000 | INHALATION_SPRAY | CUTANEOUS | Status: DC | PRN
  Administered 2023-08-12: 1 via TOPICAL
  Filled 2023-08-11: qty 56

## 2023-08-11 MED ORDER — ONDANSETRON HCL 4 MG/2ML IJ SOLN
INTRAMUSCULAR | Status: AC
  Filled 2023-08-11: qty 2

## 2023-08-11 MED ORDER — PRENATAL MULTIVITAMIN CH
1.0000 | ORAL_TABLET | Freq: Every day | ORAL | Status: DC
  Administered 2023-08-12: 1 via ORAL
  Filled 2023-08-11: qty 1

## 2023-08-11 MED ORDER — FERROUS SULFATE 325 (65 FE) MG PO TABS: 325.0000 mg | ORAL_TABLET | ORAL | Status: DC

## 2023-08-11 SURGICAL SUPPLY — 26 items
ADH SKN CLS APL DERMABOND .7 (GAUZE/BANDAGES/DRESSINGS) ×1
CLOTH BEACON ORANGE TIMEOUT ST (SAFETY) ×1 IMPLANT
DERMABOND ADVANCED .7 DNX12 (GAUZE/BANDAGES/DRESSINGS) ×1 IMPLANT
DRSG OPSITE POSTOP 3X4 (GAUZE/BANDAGES/DRESSINGS) ×1 IMPLANT
DURAPREP 26ML APPLICATOR (WOUND CARE) ×1 IMPLANT
ELECT REM PT RETURN 9FT ADLT (ELECTROSURGICAL) ×1
ELECTRODE REM PT RTRN 9FT ADLT (ELECTROSURGICAL) ×1 IMPLANT
GLOVE BIO SURGEON STRL SZ7 (GLOVE) ×1 IMPLANT
GLOVE BIOGEL PI IND STRL 7.0 (GLOVE) ×1 IMPLANT
GLOVE BIOGEL PI IND STRL 7.5 (GLOVE) ×1 IMPLANT
GOWN STRL REUS W/TWL LRG LVL3 (GOWN DISPOSABLE) ×2 IMPLANT
LIGASURE IMPACT 36 18CM CVD LR (INSTRUMENTS) IMPLANT
NEEDLE HYPO 22GX1.5 SAFETY (NEEDLE) ×1 IMPLANT
NS IRRIG 1000ML POUR BTL (IV SOLUTION) ×1 IMPLANT
PACK ABDOMINAL MINOR (CUSTOM PROCEDURE TRAY) ×1 IMPLANT
PENCIL BUTTON HOLSTER BLD 10FT (ELECTRODE) IMPLANT
PROTECTOR NERVE ULNAR (MISCELLANEOUS) ×1 IMPLANT
SPONGE LAP 4X18 RFD (DISPOSABLE) ×1 IMPLANT
SUT MNCRL AB 4-0 PS2 18 (SUTURE) ×1 IMPLANT
SUT PLAIN 2 0 (SUTURE)
SUT PLAIN ABS 2-0 CT1 27XMFL (SUTURE) IMPLANT
SUT VICRYL 0 TIES 12 18 (SUTURE) IMPLANT
SUT VICRYL 0 UR6 27IN ABS (SUTURE) ×1 IMPLANT
SYR CONTROL 10ML LL (SYRINGE) ×1 IMPLANT
TOWEL OR 17X24 6PK STRL BLUE (TOWEL DISPOSABLE) ×2 IMPLANT
TRAY FOLEY CATH SILVER 14FR (SET/KITS/TRAYS/PACK) ×1 IMPLANT

## 2023-08-11 NOTE — Discharge Summary (Signed)
     Postpartum Discharge Summary  Date of Service updated***     Patient Name: Jillian Turner DOB: 08-01-1986 MRN: [id number]  Date of admission: 08/10/2023 Delivery date:08/11/2023 Delivering provider: Celedonio Savage Date of discharge: 08/11/2023  Admitting diagnosis: Pregnant and not yet delivered in third trimester [Z34.93] Supervision of high-risk pregnancy [O09.90] Intrauterine pregnancy: [redacted]w[redacted]d     Secondary diagnosis:  Principal Problem:   Encounter for supervision of other normal pregnancy, third trimester Active Problems:   History of postpartum hemorrhage   Language barrier   Pregnant and not yet delivered in third trimester  Additional problems: ***    Discharge diagnosis: Term Pregnancy Delivered                                              Post partum procedures:{Postpartum procedures:23558} Augmentation: AROM, Pitocin, and Cytotec Complications: None  Hospital course: Induction of Labor With Vaginal Delivery   37 y.o. yo G3P2002 at [redacted]w[redacted]d was admitted to the hospital 08/10/2023 for induction of labor.  Indication for induction:  BPP 4/8 .  Patient had an uncomplicated labor course. Membrane Rupture Time/Date: 12:12 AM,08/11/2023  Delivery Method:Vaginal, Spontaneous Operative Delivery:N/A Episiotomy: None Lacerations:    Details of delivery can be found in separate delivery note.  Patient had a postpartum course complicated by***. Patient is discharged home 08/11/23.  Newborn Data: Birth date:08/11/2023 Birth time:7:25 AM Gender:Female Living status:Living Apgars: ,  Weight:   Magnesium Sulfate received: No BMZ received: No Rhophylac:N/A MMR:No T-DaP:Given prenatally Flu: No Transfusion:{Transfusion received:30440034}  Physical exam  Vitals:   08/11/23 0430 08/11/23 0500 08/11/23 0530 08/11/23 0600  BP: 128/74 128/73 (!) 114/57 133/79  Pulse: 69 78 84 79  Resp:      Temp:      TempSrc:      SpO2:      Weight:      Height:       General:  {Exam; general:21111117} Lochia: {Desc; appropriate/inappropriate:30686::"appropriate"} Uterine Fundus: {Desc; firm/soft:30687} Incision: {Exam; incision:21111123} DVT Evaluation: {Exam; dvt:2111122} Labs: Lab Results  Component Value Date   WBC 10.2 08/10/2023   HGB 11.7 (L) 08/10/2023   HCT 36.0 08/10/2023   MCV 80.7 08/10/2023   PLT 394 08/10/2023       No data to display         Edinburgh Score:     No data to display           After visit meds:  Allergies as of 08/11/2023   No Known Allergies   Med Rec must be completed prior to using this [facility]***        Discharge home in stable condition Infant Feeding: {Baby feeding:23562} Infant Disposition:{CHL IP OB HOME WITH MVHQIO:96295} Discharge instruction: per After Visit Summary and Postpartum booklet. Activity: Advance as tolerated. Pelvic rest for 6 weeks.  Diet: {OB MWUX:32440102} Future Appointments:No future appointments. Follow up Visit:   Please schedule this patient for a In person postpartum visit in 6 weeks with the following provider: Any provider. Additional Postpartum F/U: None   Low risk pregnancy complicated by:  uncomplicated Delivery mode:  Vaginal, Spontaneous Anticipated Birth Control:  BTL done PP   08/11/2023 Matthew Folks Loescher, DO

## 2023-08-11 NOTE — Progress Notes (Signed)
Labor Progress Note Jillian Turner is a 37 y.o. G3P2002 at [redacted]w[redacted]d presented for IOL for BPP 4/8. S: Patient reports contractions are getting stronger.  O:  BP (!) 151/89   Pulse 75   Temp 98.1 F (36.7 C) (Oral)   Resp 16   Ht 5\' 2"  (1.575 m)   Wt 67.4 kg   LMP 11/02/2022   BMI 27.16 kg/m  EFM: 140/min variability/Accels present, no decels  CVE: Dilation: 4 Effacement (%): 50 Station: -2 Presentation: Vertex Exam by:: Dr. Juventino Slovak   A&P: 37 y.o. Z6W1093 [redacted]w[redacted]d presented for IOL for BPP 4/8.  #Labor: Progressing well. Amniotomy performed at 0015. Clear fluid present. Continue pitocin. Recheck CVE in 4 hours unless clinically indicated. #Pain: Per patient request #FWB: Cat 1 #GBS negative  Jillian Folks Tyrelle Raczka, DO 1:24 AM

## 2023-08-11 NOTE — Progress Notes (Signed)
Labor Progress Note Jillian Turner is a 37 y.o. G3P2002 at [redacted]w[redacted]d presented for IOL for BPP 4/8.  S: Patient comfortable with epidural in place.  O:  BP 124/72   Pulse 77   Temp 98.1 F (36.7 C) (Oral)   Resp 16   Ht 5\' 2"  (1.575 m)   Wt 67.4 kg   LMP 11/02/2022   SpO2 100%   BMI 27.16 kg/m  EFM: 130/Mod variability/Accels present, early decels present  CVE: Dilation: 10 Dilation Complete Date: 08/11/23 Dilation Complete Time: 0350 Effacement (%): 100 Station: 0 Presentation: Vertex Exam by:: Veneta Penton, RN   A&P: 37 y.o. Z6X0960 [redacted]w[redacted]d presented for IOL for BPP 4/8.  #Labor: Progressing well. Will place patient in high throne and allow her to labor down to allow for descent in fetal station. #Pain: Epidural in place #FWB: Cat 1 #GBS negative  Matthew Folks Kaelob Persky, DO 4:13 AM

## 2023-08-11 NOTE — Anesthesia Procedure Notes (Signed)
Epidural Patient location during procedure: OB Start time: 08/11/2023 2:35 AM End time: 08/11/2023 2:50 AM  Staffing Anesthesiologist: Lowella Curb, MD Performed: anesthesiologist   Preanesthetic Checklist Completed: patient identified, IV checked, site marked, risks and benefits discussed, surgical consent, monitors and equipment checked, pre-op evaluation and timeout performed  Epidural Patient position: sitting Prep: ChloraPrep Patient monitoring: heart rate, cardiac monitor, continuous pulse ox and blood pressure Approach: midline Location: L2-L3 Injection technique: LOR saline  Needle:  Needle type: Tuohy  Needle gauge: 17 G Needle length: 9 cm Needle insertion depth: 4 cm Catheter type: closed end flexible Catheter size: 20 Guage Catheter at skin depth: 8 cm Test dose: negative  Assessment Events: blood not aspirated, injection not painful, no injection resistance, no paresthesia and negative IV test  Additional Notes Reason for block:procedure for pain

## 2023-08-11 NOTE — Lactation Note (Signed)
This note was copied from a baby's chart. Lactation Consultation Note  Patient Name: Jillian Turner Date: 08/11/2023 Age:35 hours Reason for consult: Initial assessment;Term;Other (Comment) (AMA)  Visited with family of 8 hours old FT female; Ms. Jillian Turner is a P3 and has some experienced breastfeeding. Her plan is to do both, breast and formula feeding, educated parents on supplementation guidelines, baby had 38 ml of Similac 20 calorie formula at 5 hours of life. Offered assistance with latch but she politely declined, she just came back from her tubal ligation and was eating her dinner. She plan on taking baby to breast tomorrow, asked her to call for assistance when needed. Reviewed normal newborn behavior, feeding cues, clutter feeding, size of baby's stomach, pumping schedule, supplementation and anticipatory guidelines.  Maternal Data Does the patient have breastfeeding experience prior to this delivery?: Yes How long did the patient breastfeed?: 4 momths for each one  Feeding Mother's Current Feeding Choice: Breast Milk and Formula Nipple Type: Slow - flow  Lactation Tools Discussed/Used Tools: Flanges;Pump Flange Size: 24;27 Breast pump type: Manual Pump Education: Setup, frequency, and cleaning;Milk Storage Reason for Pumping: mother's request Pumping frequency: PRN  Interventions Interventions: Breast feeding basics reviewed;Education;Hand pump;LC Services brochure  Plan Encouraged to put baby to breat STS +8 times/24 hours or sooner if feeding cues are present Breast massage and hand expression were also encouraged prior latching She'll try pumping whenever baby is getting a bottle with formula Parents will continue supplementing with EBM/formula per feeding choice on admission following supplementation guidelines for baby's age in hours  FOB and siblings present. All questions and concerns answered, family to contact [facility] services PRN.  Discharge Pump:  Hands Free  Consult Status Consult Status: PRN Date: 08/12/23 Follow-up type: In-patient   Jillian Turner Jillian Turner 08/11/2023, 3:59 PM

## 2023-08-11 NOTE — Progress Notes (Signed)
OB Note Patient desires permanent sterilization.  Other reversible forms of contraception were discussed with patient; she declines all other modalities. Risks of procedure discussed with patient including but not limited to: risk of regret, permanence of method, bleeding, infection, injury to surrounding organs and need for additional procedures.  Failure risk of 1 % with increased risk of ectopic gestation if pregnancy occurs was also discussed with patient.  I also discussed with her ways of BTL and will try for salpingectomies but sometimes partial is best based on anatomy. Patient verbalized understanding of these risks and wants to proceed with sterilization. Patient has receipt showing $1664 paid.  In person interpreter used  Amboy Bing, Montez Hageman MD Attending Center for [employer] (Faculty Practice) 08/11/2023 Time: [phone]

## 2023-08-11 NOTE — Transfer of Care (Signed)
Immediate Anesthesia Transfer of Care Note  Patient: Jillian Turner  Procedure(s) Performed: POST PARTUM TUBAL LIGATION  Patient Location: PACU  Anesthesia Type:Epidural  Level of Consciousness: awake  Airway & Oxygen Therapy: Patient Spontanous Breathing  Post-op Assessment: Report given to RN and Post -op Vital signs reviewed and stable  Post vital signs: Reviewed and stable  Last Vitals:  Vitals Value Taken Time  BP    Temp    Pulse    Resp    SpO2      Last Pain:  Vitals:   08/11/23 1039  TempSrc: Oral  PainSc:          Complications: No notable events documented.

## 2023-08-11 NOTE — Anesthesia Preprocedure Evaluation (Signed)
Anesthesia Evaluation  Patient identified by MRN, date of birth, ID band Patient awake    Reviewed: Allergy & Precautions, Patient's Chart, lab work & pertinent test results  History of Anesthesia Complications Negative for: history of anesthetic complications  Airway Mallampati: II  TM Distance: >3 FB Neck ROM: Full    Dental  (+) Teeth Intact   Pulmonary neg pulmonary ROS   breath sounds clear to auscultation       Cardiovascular negative cardio ROS  Rhythm:Regular     Neuro/Psych negative neurological ROS  negative psych ROS   GI/Hepatic negative GI ROS, Neg liver ROS,,,  Endo/Other  negative endocrine ROS    Renal/GU negative Renal ROS     Musculoskeletal   Abdominal   Peds  Hematology negative hematology ROS (+)   Anesthesia Other Findings   Reproductive/Obstetrics (+) Pregnancy                              Anesthesia Physical Anesthesia Plan  ASA: II  Anesthesia Plan: Epidural   Post-op Pain Management:    Induction:   PONV Risk Score and Plan:   Airway Management Planned:   Additional Equipment:   Intra-op Plan:   Post-operative Plan:   Informed Consent: I have reviewed the patients History and Physical, chart, labs and discussed the procedure including the risks, benefits and alternatives for the proposed anesthesia with the patient or authorized representative who has indicated his/her understanding and acceptance.     Dental advisory given  Plan Discussed with: Anesthesiologist  Anesthesia Plan Comments:          Anesthesia Quick Evaluation

## 2023-08-11 NOTE — Anesthesia Preprocedure Evaluation (Addendum)
Anesthesia Evaluation  Patient identified by MRN, date of birth, ID band Patient awake    Reviewed: Allergy & Precautions, NPO status , Patient's Chart, lab work & pertinent test results  History of Anesthesia Complications Negative for: history of anesthetic complications  Airway Mallampati: II  TM Distance: >3 FB Neck ROM: Full    Dental no notable dental hx. (+) Teeth Intact   Pulmonary neg pulmonary ROS   Pulmonary exam normal breath sounds clear to auscultation       Cardiovascular negative cardio ROS Normal cardiovascular exam Rhythm:Regular Rate:Normal     Neuro/Psych negative neurological ROS  negative psych ROS   GI/Hepatic negative GI ROS, Neg liver ROS,,,  Endo/Other  negative endocrine ROS    Renal/GU negative Renal ROS     Musculoskeletal   Abdominal   Peds  Hematology negative hematology ROS (+)   Anesthesia Other Findings   Reproductive/Obstetrics                             Anesthesia Physical Anesthesia Plan  ASA: 2  Anesthesia Plan: Epidural   Post-op Pain Management: Ofirmev IV (intra-op)*   Induction:   PONV Risk Score and Plan: 4 or greater and Ondansetron, Dexamethasone, Treatment may vary due to age or medical condition and Scopolamine patch - Pre-op  Airway Management Planned: Natural Airway  Additional Equipment:   Intra-op Plan:   Post-operative Plan:   Informed Consent: I have reviewed the patients History and Physical, chart, labs and discussed the procedure including the risks, benefits and alternatives for the proposed anesthesia with the patient or authorized representative who has indicated his/her understanding and acceptance.     Dental advisory given  Plan Discussed with: CRNA  Anesthesia Plan Comments:         Anesthesia Quick Evaluation

## 2023-08-11 NOTE — Op Note (Signed)
Jillian Turner 08/11/2023  PREOPERATIVE DIAGNOSES: Multiparity, undesired fertility  POSTOPERATIVE DIAGNOSES: Multiparity, undesired fertility  PROCEDURE:  Postpartum Bilateral Salpingectomy  SURGEON: Adam Phenix, MD  ANESTHESIA: Spinal and local analgesia using 6 ml of 0.25% Marcaine  COMPLICATIONS:  None immediate.  ESTIMATED BLOOD LOSS: 10 ml.  INDICATIONS:  37 y.o. [id number] with undesired fertility, status post vaginal delivery, desires permanent sterilization.  Other reversible forms of contraception were discussed with patient; she declines all other modalities. Risks of procedure discussed with patient including but not limited to: risk of regret, permanence of method, bleeding, infection, injury to surrounding organs and need for additional procedures.  Discussed failure risk of 1% with increased risk of ectopic gestation if pregnancy occurs.  Also discussed possibility of post-tubal syndrome with increased pelvic pain or menstrual irregularities.  Patient verbalized understanding of these risks and wants to proceed with sterilization.  Written informed consent obtained.     FINDINGS:  Normal uterus, tubes, and ovaries. Excised fallopian tubes were sent to pathology.   PROCEDURE DETAILS: The patient was taken to the operating room where her epidural anesthesia was dosed up to surgical level and found to be adequate.  She was then placed in the dorsal supine position and prepped and draped in sterile fashion.   After an adequate timeout was performed, attention was turned to the patient's abdomen   A small transverse skin incision was made under the umbilical fold after local analgesia was administered.. The incision was taken down to the layer of fascia using the scalpel, and fascia was incised, and extended bilaterally using Mayo scissors. The peritoneum was entered in a sharp fashion.    Attention was then turned to the patient's uterus, and left fallopian tube was then  identified, and Babcock clamps were used to grasp the tube. The Ligasure device was used to excise the entire left fallopian tube from the underlying mesosalpinx and the uterus.  The right fallopian tube was then identified, grasped with Babcock clamps and excised in a similar fashion using the Ligasure device allowing for bilateral salpingectomy.   Good hemostasis was noted overall.  The instruments were then removed from the patient's abdomen and the fascial incision was repaired with 0 Vicryl, and the skin was closed with a 4-0 Vicryl subcuticular stitch. The patient tolerated the procedure well.  Instrument, sponge, and needle counts were correct times three.  The patient was then taken to the recovery room awake and in stable condition.   Adam Phenix, MD Obstetrician & Gynecologist, [facility] for [facility], [facility]

## 2023-08-12 ENCOUNTER — Other Ambulatory Visit (HOSPITAL_COMMUNITY): Payer: Self-pay

## 2023-08-12 ENCOUNTER — Encounter (HOSPITAL_COMMUNITY): Payer: Self-pay | Admitting: Obstetrics & Gynecology

## 2023-08-12 LAB — BIRTH TISSUE RECOVERY COLLECTION (PLACENTA DONATION)

## 2023-08-12 MED ORDER — SENNOSIDES-DOCUSATE SODIUM 8.6-50 MG PO TABS
2.0000 | ORAL_TABLET | Freq: Every day | ORAL | 0 refills | Status: AC
  Filled 2023-08-12: qty 30, 15d supply, fill #0

## 2023-08-12 MED ORDER — IBUPROFEN 600 MG PO TABS
600.0000 mg | ORAL_TABLET | Freq: Four times a day (QID) | ORAL | 0 refills | Status: AC
Start: 2023-08-12 — End: ?
  Filled 2023-08-12: qty 30, 8d supply, fill #0

## 2023-08-12 MED ORDER — BENZOCAINE-MENTHOL 20-0.5 % EX AERO
1.0000 | INHALATION_SPRAY | CUTANEOUS | 0 refills | Status: AC | PRN
  Filled 2023-08-12: qty 78, fill #0

## 2023-08-12 MED ORDER — OXYCODONE HCL 5 MG PO TABS
5.0000 mg | ORAL_TABLET | ORAL | 0 refills | Status: AC | PRN
  Filled 2023-08-12: qty 10, 2d supply, fill #0

## 2023-08-12 MED ORDER — ACETAMINOPHEN 325 MG PO TABS
650.0000 mg | ORAL_TABLET | ORAL | 0 refills | Status: AC | PRN
  Filled 2023-08-12: qty 100, 9d supply, fill #0

## 2023-08-12 NOTE — Lactation Note (Signed)
This note was copied from a baby's chart. Lactation Consultation Note  Patient Name: Jillian Turner Today's Date: 08/12/2023 Age:35 hours  P3, [redacted]w[redacted]d, 5% weight loss  Mother is formula feeding and infant is taking 50 ml by bottle.    Consult Status  complete    Jillian Turner 08/12/2023, 11:57 AM

## 2023-08-12 NOTE — Anesthesia Postprocedure Evaluation (Signed)
Anesthesia Post Note  Patient: Jillian Turner  Procedure(s) Performed: AN AD HOC LABOR EPIDURAL     Patient location during evaluation: Mother Baby Anesthesia Type: Epidural Level of consciousness: awake, oriented and awake and alert Pain management: pain level controlled Vital Signs Assessment: post-procedure vital signs reviewed and stable Respiratory status: spontaneous breathing, respiratory function stable and nonlabored ventilation Cardiovascular status: stable Postop Assessment: no headache, adequate PO intake, able to ambulate, no apparent nausea or vomiting and patient able to bend at knees Anesthetic complications: no   No notable events documented.  Last Vitals:  Vitals:   08/12/23 0531 08/12/23 0846  BP: 112/63 139/75  Pulse: 86 73  Resp: 17 18  Temp: 36.6 C 36.5 C  SpO2:  100%    Last Pain:  Vitals:   08/12/23 0846  TempSrc: Oral  PainSc:    Pain Goal: Patients Stated Pain Goal: 5 (08/11/23 1415)                 Meylin Stenzel

## 2023-08-12 NOTE — Anesthesia Postprocedure Evaluation (Signed)
Anesthesia Post Note  Patient: Jillian Turner  Procedure(s) Performed: POST PARTUM TUBAL LIGATION     Patient location during evaluation: PACU Anesthesia Type: Epidural Level of consciousness: awake and alert Pain management: pain level controlled Vital Signs Assessment: post-procedure vital signs reviewed and stable Respiratory status: spontaneous breathing, nonlabored ventilation and respiratory function stable Cardiovascular status: stable Postop Assessment: no headache, no backache and epidural receding Anesthetic complications: no   No notable events documented.  Last Vitals:  Vitals:   08/12/23 0846 08/12/23 1242  BP: 139/75 121/85  Pulse: 73 72  Resp: 18 16  Temp: 36.5 C (!) 36.4 C  SpO2: 100% 100%    Last Pain:  Vitals:   08/12/23 1242  TempSrc: Oral  PainSc:                  Lewie Loron

## 2023-08-13 LAB — SURGICAL PATHOLOGY

## 2023-08-16 ENCOUNTER — Inpatient Hospital Stay (HOSPITAL_COMMUNITY): Admission: RE | Admit: 2023-08-16 | Payer: Self-pay | Source: Home / Self Care | Admitting: Family Medicine

## 2023-08-16 ENCOUNTER — Inpatient Hospital Stay (HOSPITAL_COMMUNITY): Payer: Self-pay

## 2023-08-18 NOTE — H&P (Signed)
LABOR AND DELIVERY ADMISSION HISTORY AND PHYSICAL NOTE   Jillian Turner is a 37 y.o. female G3P2002 with IUP at [redacted]w[redacted]d presenting for IOL for BPP 4/8.    Patient reports the fetal movement as active. Patient reports uterine contraction  activity as regular. Patient reports  vaginal bleeding as none. Patient describes fluid per vagina as None.    Patient denies headache, vision changes, chest pain, shortness of breath, right upper quadrant pain, or LE edema.   She plans on breast feeding and bottle feeding feeding. Her contraception plan is: bilateral tubal ligation.   Prenatal History/Complications: PNC at [facility]   Sono:  @[redacted]w[redacted]d , CWD, normal anatomy, breech presentation, posterior placenta, 61%ile, EFW 779 g   Pregnancy complications:      Patient Active Problem List    Diagnosis Date Noted   Pregnant and not yet delivered in third trimester 08/10/2023   Supervision of high-risk pregnancy 08/10/2023   Tachycardia 06/07/2023   Language barrier 05/27/2023   Encounter for supervision of other normal pregnancy, third trimester 04/19/2023   History of postpartum hemorrhage 02/02/2023      Past Medical History:     Past Medical History:  Diagnosis Date   Medical history non-contributory     Varicosities      bilateral legs          Past Surgical History:      Past Surgical History:  Procedure Laterality Date   NO PAST SURGERIES              Obstetrical History: OB History       Gravida  3   Para  2   Term  2   Preterm      AB      Living  2        SAB      IAB      Ectopic      Multiple  0   Live Births  1               Social History: Social History         Socioeconomic History   Marital status: Married      Spouse name: Not on file   Number of children: Not on file   Years of education: Not on file   Highest education level: Not on file  Occupational History   Not on file  Tobacco Use   Smoking status: Never   Smokeless  tobacco: Never  Substance and Sexual Activity   Alcohol use: No   Drug use: No   Sexual activity: Yes      Birth control/protection: Surgical  Other Topics Concern   Not on file  Social History Narrative   Not on file    Social Determinants of Health        Financial Resource Strain: Not on file  Food Insecurity: No Food Insecurity (08/10/2023)    Hunger Vital Sign     Worried About Running Out of Food in the Last Year: Never true     Ran Out of Food in the Last Year: Never true  Transportation Needs: No Transportation Needs (08/10/2023)    PRAPARE - Therapist, art (Medical): No     Lack of Transportation (Non-Medical): No  Physical Activity: Not on file  Stress: Not on file  Social Connections: Not on file      Family History:      Family History  Problem  Relation Age of Onset   Kidney disease Brother            Allergies: Allergies  No Known Allergies            Medications Prior to Admission  Medication Sig Dispense Refill Last Dose   ferrous sulfate 325 (65 FE) MG EC tablet Take 1 tablet (325 mg total) by mouth every other day. 90 tablet 3 08/09/2023   Prenatal Vit-Fe Fumarate-FA (PRENATAL MULTIVITAMIN) TABS tablet Take 1 tablet by mouth daily at 12 noon.     08/09/2023            Review of Systems  All systems reviewed and negative except as stated in HPI   Physical Exam BP (!) 147/91 (BP Location: Left Arm)   Pulse 87   Temp 98.4 F (36.9 C) (Oral)   Resp 18   Ht 5\' 2"  (1.575 m)   Wt 67.4 kg   LMP 11/02/2022   BMI 27.16 kg/m    Physical Exam Physical Exam Vitals and nursing note reviewed.  Constitutional:      General: She is not in acute distress.    Appearance: Normal appearance.  HENT:     Head: Normocephalic.  Cardiovascular:     Rate and Rhythm: Normal rate.  Pulmonary:     Effort: Pulmonary effort is normal. No respiratory distress.  Musculoskeletal:        General: Normal range of motion.     Cervical  back: Normal range of motion.  Skin:    General: Skin is warm and dry.  Neurological:     Mental Status: She is alert and oriented to person, place, and time.  Psychiatric:        Mood and Affect: Mood normal.        Behavior: Behavior normal.     Presentation: cephalic by ultrasound   Fetal monitoring: Baseline: 145 bpm, Variability: Good {> 6 bpm), Accelerations: Reactive, and Decelerations: Absent Uterine activity: Frequency: Every 3-4 minutes   Dilation: Closed Presentation: Vertex (by bedside ultrasound) Exam by:: Diannia Ruder, RN Prenatal labs: ABO, Rh: --/--/O POS (08/13 1538) Antibody: NEG (08/13 1538) Rubella: 8.43 (04/15 0941) RPR: Non Reactive (04/15 0941)  HBsAg: Negative (04/15 0941)  HIV: Non Reactive (04/15 0941)  GC/Chlamydia:  Last Labs       Neisseria Gonorrhea  Date Value Ref Range Status  07/13/2023 Negative   Final         Chlamydia  Date Value Ref Range Status  07/13/2023 Negative   Final      GBS: Negative/-- (07/16 1025)      Prenatal Transfer Tool  Maternal Diabetes: No Genetic Screening: Declined Maternal Ultrasounds/Referrals: Normal Fetal Ultrasounds or other Referrals:  None Maternal Substance Abuse:  No Significant Maternal Medications:  Meds include: Other:  iron Significant Maternal Lab Results: Group B Strep negative         Results for orders placed or performed during the hospital encounter of 08/10/23 (from the past 24 hour(s))  Type and screen [facility]    Collection Time: 08/10/23  3:38 PM  Result Value Ref Range    ABO/RH(D) O POS      Antibody Screen NEG      Sample Expiration 08/13/2023,2359      Unit Number [id number]      Blood Component Type RED CELLS,LR      Unit division 00      Status of Unit ALLOCATED  Transfusion Status OK TO TRANSFUSE      Crossmatch Result          Compatible Performed at [facility], [address], [place], [place] [address]      Unit Number  [id number]      Blood Component Type RED CELLS,LR      Unit division 00      Status of Unit ALLOCATED      Transfusion Status OK TO TRANSFUSE      Crossmatch Result Compatible    BPAM RBC    Collection Time: 08/10/23  3:38 PM  Result Value Ref Range    ISSUE DATE / TIME [id number]      Blood Product Unit Number [id number]      PRODUCT CODE E0382V00      Unit Type and Rh 5100      Blood Product Expiration Date [id number]      ISSUE DATE / TIME [id number]      Blood Product Unit Number [id number]      PRODUCT CODE E0382V00      Unit Type and Rh 5100      Blood Product Expiration Date [id number]    CBC    Collection Time: 08/10/23  3:39 PM  Result Value Ref Range    WBC 10.2 4.0 - 10.5 K/uL    RBC 4.46 3.87 - 5.11 MIL/uL    Hemoglobin 11.7 (L) 12.0 - 15.0 g/dL    HCT 20.2 54.2 - 70.6 %    MCV 80.7 80.0 - 100.0 fL    MCH 26.2 26.0 - 34.0 pg    MCHC 32.5 30.0 - 36.0 g/dL    RDW 23.7 (H) 62.8 - 15.5 %    Platelets 394 150 - 400 K/uL    nRBC 0.0 0.0 - 0.2 %  Prepare RBC (crossmatch)    Collection Time: 08/10/23  4:28 PM  Result Value Ref Range    Order Confirmation          ORDER PROCESSED BY BLOOD BANK Performed at [facility], [address], [place], [place] [address]        Assessment: Jillian Turner is a 37 y.o. G3P2002 at [redacted]w[redacted]d here for IOL for BPP 4/8 at term.   #Labor: Initial CVE closed/thick. Will start with cytotec 50/25  #Pain:IV pain meds PRN, epidural upon request #FHT:  Category I #GBS/ID: Negative #MOF: breast feeding and bottle feeding #MOC: bilateral tubal ligation #Circ: No       Para March, DO [facility] [employer], [facility]   I confirm that I have verified and agree with the information documented in the resident's note.   Patient A&O x3 in no acute distress. Cervical exam completed by RN. Desires BTL and patiently awaiting a baby boy.   Carlynn Herald, CNM 08/18/2023 10:08 AM

## 2023-09-08 ENCOUNTER — Telehealth (HOSPITAL_COMMUNITY): Payer: Self-pay | Admitting: *Deleted

## 2023-09-08 NOTE — Telephone Encounter (Signed)
09/08/2023  Name: Jillian Turner MRN: [id number] DOB: Oct 26, 1986  Reason for Call:  Transition of Care Hospital Discharge Call  Contact Status: Patient Contact Status: Unable to contact ("voice mail box full")  Language assistant needed: Interpreter Mode: Telephonic Interpreter Interpreter Name: Clydie Braun [phone]        Follow-Up Questions:    Inocente Salles Postnatal Depression Scale:  In the Past 7 Days:    PHQ2-9 Depression Scale:     Discharge Follow-up:    Post-discharge interventions: NA  Salena Saner, RN 09/08/2023 16:10

## 2023-09-14 ENCOUNTER — Encounter: Payer: Self-pay | Admitting: Student

## 2023-09-14 ENCOUNTER — Ambulatory Visit (INDEPENDENT_AMBULATORY_CARE_PROVIDER_SITE_OTHER): Payer: Self-pay | Admitting: Student

## 2023-09-14 NOTE — Progress Notes (Signed)
  [facility] Postpartum Visit   Jillian Turner is a 37 y.o. [phone] presenting for a postpartum visit.  She has the following concerns today: None. She delivered via SVD at 40.2.  She reports her vaginal bleeding is done. She is bottle feeding her infant. She feels she is bonding well. She has a BTL for contraception.   Edinburgh Postnatal Depression Scale: 0  Reviewed pregnancy and delivery course: yes, uncomplicated. IOL for BPP 4/8.  -  Vitals:   09/14/23 1011  BP: 110/78  Pulse: 72  SpO2: 99%   Exam: Well appearing, ambulating normally, in good spirits. Abdomen without distention, tenderness, or organomegaly.  Pelvic exam: Deferred given no lacerations and no symptoms   A/P:  Postpartum visit: patient is 5 weeks postpartum following a vaginal delivery. -Discussed patients delivery course -Patient had no lacerations, perineal healing reviewed. Patient expressed understanding -Patient has urinary incontinence? No  -Patient is safe to resume physical and sexual activity in one week or so -Patient does not want a pregnancy in the next year.  Desired family size is 3 children.  -Reviewed forms of contraception in tiered fashion. Patient is s/p bilateral tubal ligation .   -Return to sexual activity and contraception discussed as above.  -Breastfeeding: No,  -Mood: good   -Discussed sleep and fatigue management and encouraged family/community support. She actually feels she is sleeping well.  -Postpartum vaccines: None indicated.  -Need for postpartum diabetes screening:  no  (2 hour glucose tolerance testing between 6-12 weeks postpartum)  -Reviewed prior Pap, she tells me [facility]. Records not presently available.  -Return as needed. She will resume primary care at the [facility].   Eliezer Mccoy, MD

## 2023-09-19 NOTE — Addendum Note (Signed)
Addended by: Manson Passey, Nirvana Blanchett on: 09/19/2023 11:19 AM   Modules accepted: Level of Service
